# Patient Record
Sex: Male | Born: 1975
Health system: Southern US, Community
[De-identification: ages and names within clinical notes are randomized; demographics above are authoritative.]

## PROBLEM LIST (undated history)

## (undated) DIAGNOSIS — I499 Cardiac arrhythmia, unspecified: Secondary | ICD-10-CM

## (undated) DIAGNOSIS — R112 Nausea with vomiting, unspecified: Secondary | ICD-10-CM

## (undated) DIAGNOSIS — K219 Gastro-esophageal reflux disease without esophagitis: Secondary | ICD-10-CM

## (undated) DIAGNOSIS — Z9889 Other specified postprocedural states: Secondary | ICD-10-CM

## (undated) HISTORY — PX: DIAGNOSTIC LAPAROSCOPY: SUR761

---

## 2007-03-28 ENCOUNTER — Ambulatory Visit: Payer: Self-pay | Admitting: Family Medicine

## 2008-07-23 ENCOUNTER — Ambulatory Visit: Payer: Self-pay | Admitting: Diagnostic Radiology

## 2008-07-23 ENCOUNTER — Emergency Department (HOSPITAL_BASED_OUTPATIENT_CLINIC_OR_DEPARTMENT_OTHER): Admission: EM | Admit: 2008-07-23 | Discharge: 2008-07-23 | Payer: Self-pay | Admitting: Emergency Medicine

## 2008-12-12 ENCOUNTER — Ambulatory Visit: Payer: Self-pay | Admitting: Sports Medicine

## 2008-12-12 DIAGNOSIS — K429 Umbilical hernia without obstruction or gangrene: Secondary | ICD-10-CM | POA: Insufficient documentation

## 2009-07-18 ENCOUNTER — Ambulatory Visit: Payer: Self-pay | Admitting: Family Medicine

## 2009-08-16 ENCOUNTER — Emergency Department (HOSPITAL_BASED_OUTPATIENT_CLINIC_OR_DEPARTMENT_OTHER): Admission: EM | Admit: 2009-08-16 | Discharge: 2009-08-16 | Payer: Self-pay | Admitting: Emergency Medicine

## 2010-02-03 ENCOUNTER — Ambulatory Visit: Payer: Self-pay | Admitting: Family Medicine

## 2010-02-23 IMAGING — CT CT ABDOMEN W/ CM
2 of 5 series · 15 of 46 positions shown, 17 images · IV contrast (APPLIED)
Comparison: None available

CT ABDOMEN

CLINICAL DATA: Abdominal pain.  Nausea.

CT ABDOMEN AND PELVIS WITH CONTRAST
TECHNIQUE: Multidetector CT imaging of the abdomen and pelvis was
performed using the standard protocol following bolus
administration of intravenous contrast.
Contrast: 100 ml [id number].

[Series 2: abd/pelvis 5.0 b31f · axial · 0.72mm/px · z∈[-279,+146]mm · 12 of 96 slices shown, 14 images]
[im 6/96  soft-tissue]
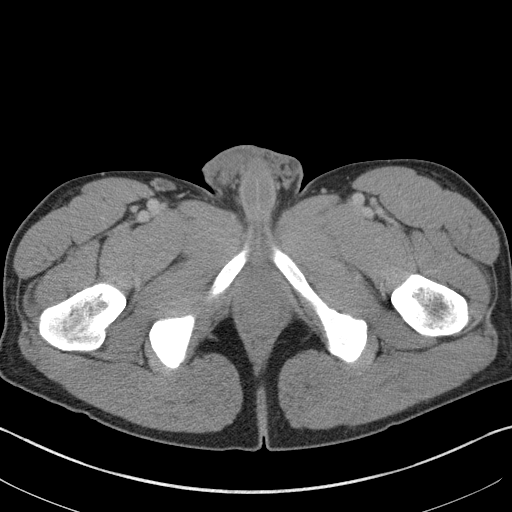
[im 6/96  bone]
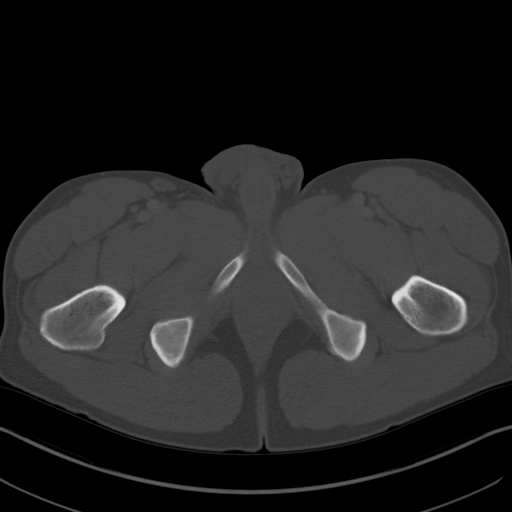
[im 16/96  soft-tissue]
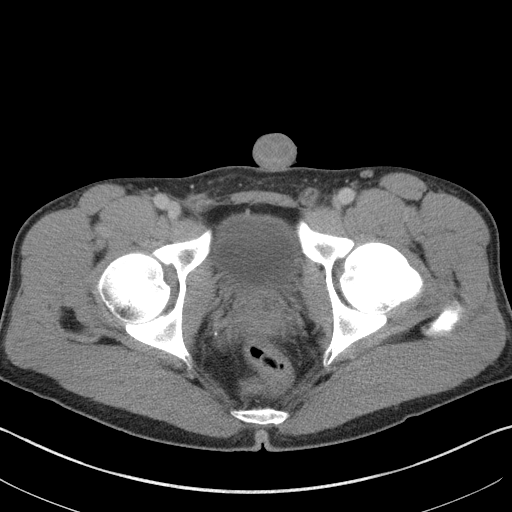
[im 21/96  soft-tissue]
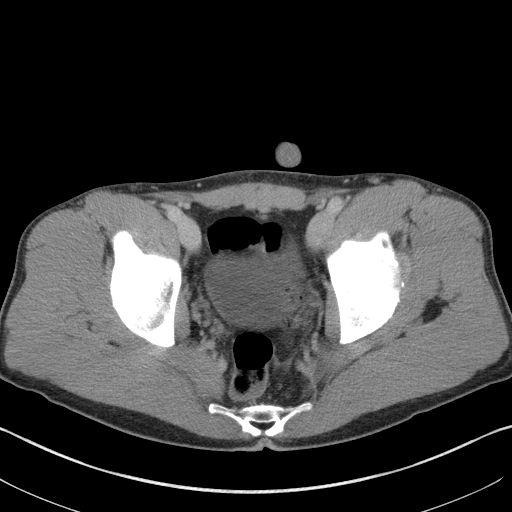
[im 31/96  soft-tissue]
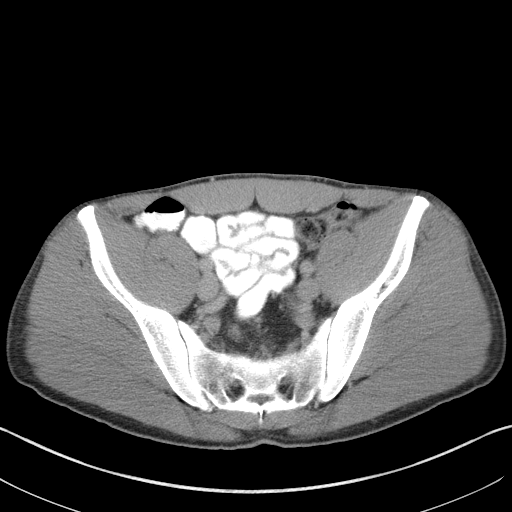
[im 36/96  soft-tissue]
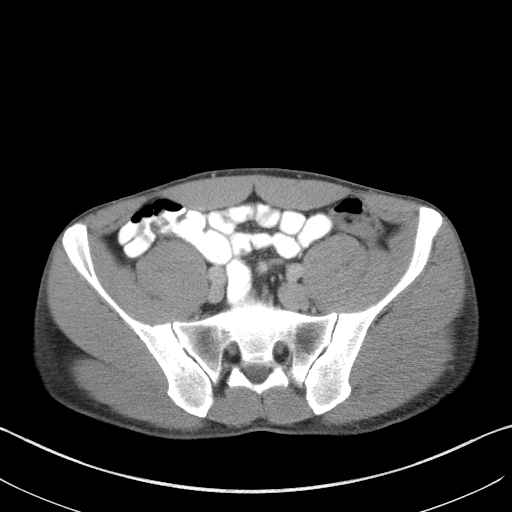
[im 46/96  soft-tissue]
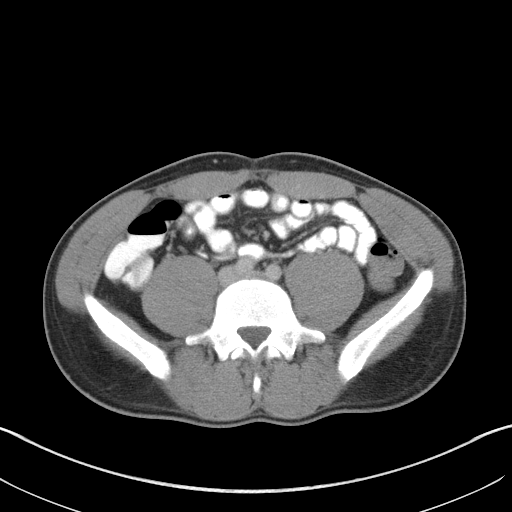
[im 51/96  soft-tissue]
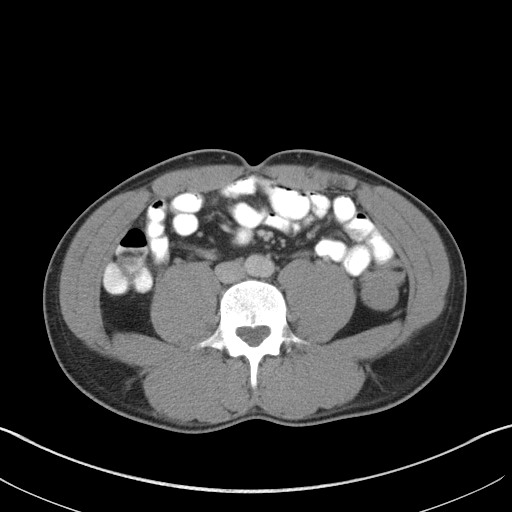
[im 61/96  soft-tissue]
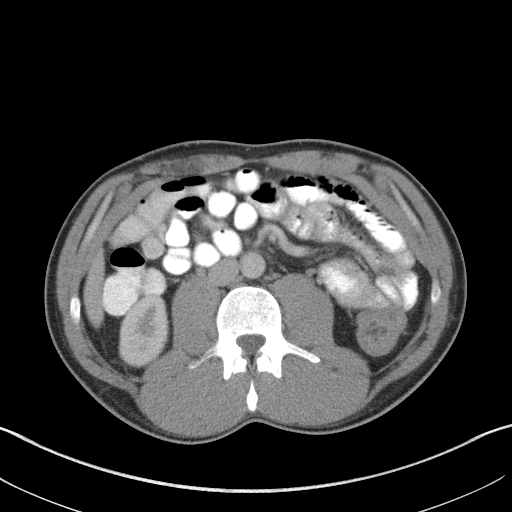
[im 66/96  soft-tissue]
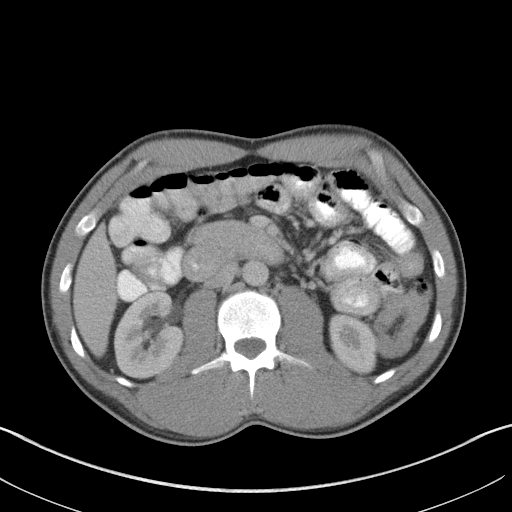
[im 66/96  bone]
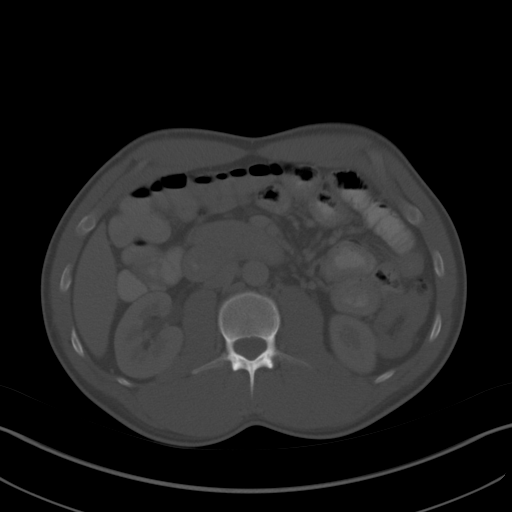
[im 76/96  soft-tissue]
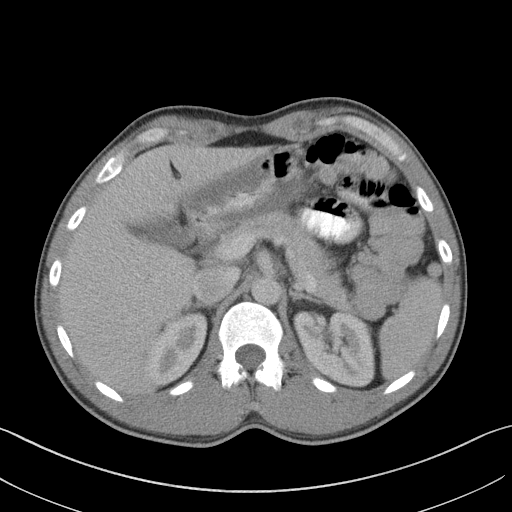
[im 81/96  soft-tissue]
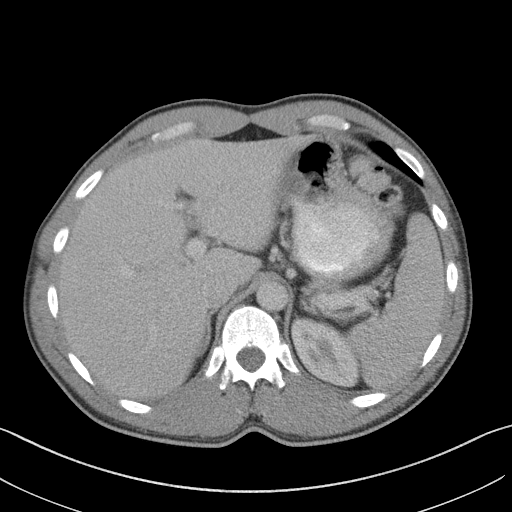
[im 91/96  soft-tissue]
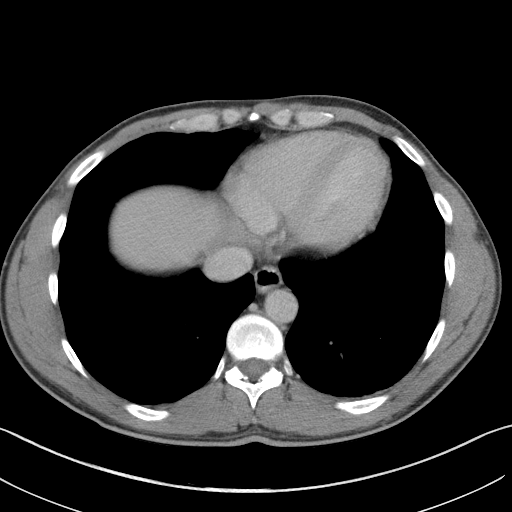

[Series 3: abd/pelvis 2.0 coronal · coronal · 0.80mm/px · 3 of 119 slices shown]
[im 40/119  soft-tissue]
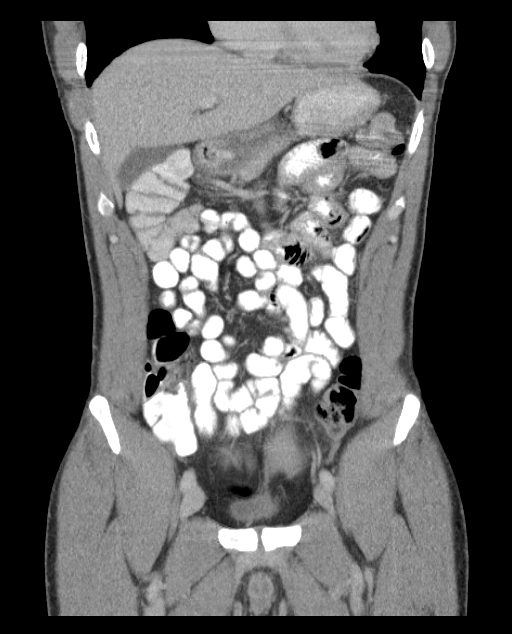
[im 53/119  soft-tissue]
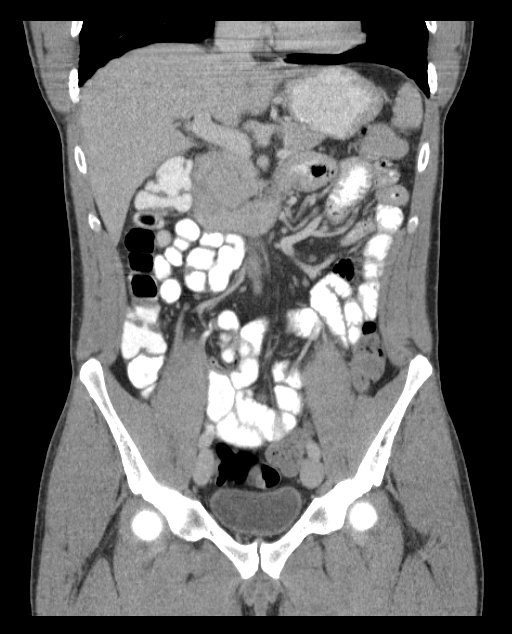
[im 66/119  soft-tissue]
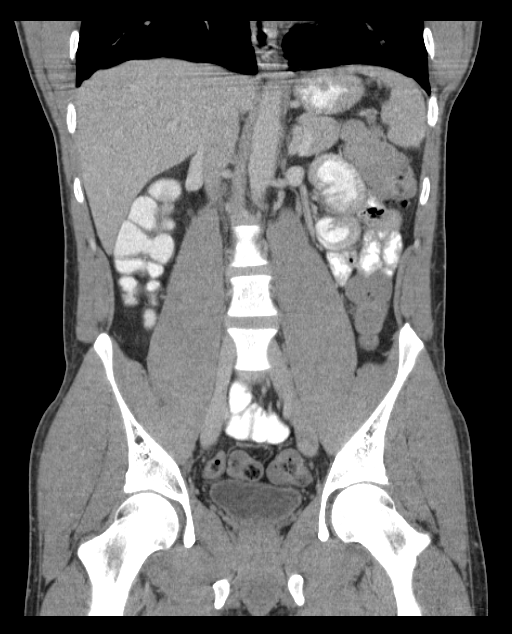

[15 of 46 positions shown; findings below may reference images not displayed]

FINDINGS: Lung bases appear clear.  Liver and gallbladder within
normal limits.  Spleen unremarkable.  Normal renal enhancement and
delayed excretion of contrast.  The antrum of the stomach is
markedly thickened, measuring up to 1.8 cm.  There is low
attenuation of the antrum as well.  Distal to the pylorus, the
proximal duodenum appears within normal limits.  The distal
duodenum and jejunum are abnormal, with dilation and wall
thickening.  There is no obstructing lesion identified.  Beginning
in the mid jejunum, more normal caliber and wall thickness is
assumed.  No significant adenopathy.  No perforation or free air.
IMPRESSION: 1.  Marked thickening of the antrum of the stomach with low
attenuation.  Differential considerations include inflammatory
bowel disease/Crohn's disease, lymphoma, or severe gastritis.
Endoscopic correlation recommended. Gastrointestinal stromal tumor
felt unlikely based on multi focality.
2.  Dilation and thickening of distal duodenum and proximal
jejunum, with mural thickening and mild dilation.  The differential
considerations same as above.  Infection with atypical organisms
could produce this appearance as well.

CT PELVIS
FINDINGS: The pelvic small bowel appears within normal limits.
Terminal ileum within normal limits. The cecum appears within
normal limits.  Ileocecal valve unremarkable.  Normal appendix
identified.  No free fluid in the anatomic pelvis.  Mild thickening
of the rectum is likely due to under distention.

Marked thickening of the superior descending colon is present, at
the level of the splenic flexure on image number 30.  Mural
thickness extends up to 11 mm.  This is immediately adjacent to
inflamed jejunum.  Transverse colon assumes a more normal mural
thickness and the ascending colon is within normal limits aside
from air fluid levels.
IMPRESSION: Colitis of the proximal descending colon near the splenic flexure.
Given multi focality of the enteritis, Crohn's disease is given
primary consideration with lymphoma felt less likely.  Infection
could produce this appearance but this would be an uncommon
presentation for infectious enterocolitis.

## 2010-04-27 ENCOUNTER — Ambulatory Visit: Payer: Self-pay | Admitting: Family Medicine

## 2010-09-22 LAB — URINALYSIS, ROUTINE W REFLEX MICROSCOPIC
Bilirubin Urine: NEGATIVE
Hgb urine dipstick: NEGATIVE
Ketones, ur: 15 mg/dL — AB
Specific Gravity, Urine: 1.028 (ref 1.005–1.030)
pH: 7 (ref 5.0–8.0)

## 2010-09-22 LAB — LIPASE, BLOOD: Lipase: 379 U/L — ABNORMAL HIGH (ref 23–300)

## 2010-09-22 LAB — CBC
Hemoglobin: 16.6 g/dL (ref 13.0–17.0)
MCHC: 35.4 g/dL (ref 30.0–36.0)
MCV: 91.2 fL (ref 78.0–100.0)
RBC: 5.14 MIL/uL (ref 4.22–5.81)
RDW: 12.9 % (ref 11.5–15.5)

## 2010-09-22 LAB — COMPREHENSIVE METABOLIC PANEL
BUN: 16 mg/dL (ref 6–23)
CO2: 27 mEq/L (ref 19–32)
Calcium: 10.2 mg/dL (ref 8.4–10.5)
Creatinine, Ser: 1.2 mg/dL (ref 0.4–1.5)
GFR calc non Af Amer: >60 mL/min (ref >60)
Glucose, Bld: 114 mg/dL — ABNORMAL HIGH (ref 70–99)
Total Protein: 8.1 g/dL (ref 6.0–8.3)

## 2010-09-22 LAB — DIFFERENTIAL
Eosinophils Absolute: 0.1 10*3/uL (ref 0.0–0.7)
Lymphocytes Relative: 12 % (ref 12–46)
Lymphs Abs: 1.2 10*3/uL (ref 0.7–4.0)
Neutro Abs: 8.4 10*3/uL — ABNORMAL HIGH (ref 1.7–7.7)
Neutrophils Relative %: 81 % — ABNORMAL HIGH (ref 43–77)

## 2010-11-08 ENCOUNTER — Emergency Department (INDEPENDENT_AMBULATORY_CARE_PROVIDER_SITE_OTHER): Payer: BC Managed Care – PPO

## 2010-11-08 ENCOUNTER — Emergency Department (HOSPITAL_BASED_OUTPATIENT_CLINIC_OR_DEPARTMENT_OTHER)
Admission: EM | Admit: 2010-11-08 | Discharge: 2010-11-08 | Disposition: A | Discharge status: Home / Self Care | Payer: BC Managed Care – PPO | Attending: Emergency Medicine | Admitting: Emergency Medicine

## 2010-11-08 DIAGNOSIS — R002 Palpitations: Secondary | ICD-10-CM | POA: Insufficient documentation

## 2010-11-08 DIAGNOSIS — R079 Chest pain, unspecified: Secondary | ICD-10-CM | POA: Insufficient documentation

## 2010-11-08 LAB — CBC
HCT: 39.4 % (ref 39.0–52.0)
Hemoglobin: 14.4 g/dL (ref 13.0–17.0)
RBC: 4.69 MIL/uL (ref 4.22–5.81)
RDW: 12.4 % (ref 11.5–15.5)
WBC: 7 10*3/uL (ref 4.0–10.5)

## 2010-11-08 LAB — COMPREHENSIVE METABOLIC PANEL
ALT: 12 U/L (ref 0–53)
Albumin: 4.4 g/dL (ref 3.5–5.2)
Alkaline Phosphatase: 54 U/L (ref 39–117)
Chloride: 100 mEq/L (ref 96–112)
Glucose, Bld: 111 mg/dL — ABNORMAL HIGH (ref 70–99)
Potassium: 3.8 mEq/L (ref 3.5–5.1)
Sodium: 138 mEq/L (ref 135–145)
Total Protein: 7.4 g/dL (ref 6.0–8.3)

## 2010-11-08 LAB — CK TOTAL AND CKMB (NOT AT ARMC): Total CK: 140 U/L (ref 7–232)

## 2010-11-09 ENCOUNTER — Telehealth: Payer: Self-pay

## 2010-11-09 LAB — TSH: TSH: 1.104 u[IU]/mL (ref 0.350–4.500)

## 2010-11-09 NOTE — Telephone Encounter (Signed)
Pt informed and made apt.to come in

## 2010-11-11 ENCOUNTER — Encounter: Payer: Self-pay | Admitting: Family Medicine

## 2010-11-12 ENCOUNTER — Ambulatory Visit (INDEPENDENT_AMBULATORY_CARE_PROVIDER_SITE_OTHER): Payer: BC Managed Care – PPO | Admitting: Family Medicine

## 2010-11-12 ENCOUNTER — Encounter: Payer: Self-pay | Admitting: Family Medicine

## 2010-11-12 VITALS — BP 108/76 | HR 56 | Temp 98.3°F | Wt 186.0 lb

## 2010-11-12 DIAGNOSIS — K219 Gastro-esophageal reflux disease without esophagitis: Secondary | ICD-10-CM

## 2010-11-12 DIAGNOSIS — Z8679 Personal history of other diseases of the circulatory system: Secondary | ICD-10-CM

## 2010-11-12 NOTE — Progress Notes (Signed)
  Subjective:    Patient ID: Ronald Clark, male    DOB: 1975/06/16, 35 y.o.   MRN: [id number]  HPI he is here for followup visit. He was recently seen in an urgent care Center and evaluated for rapid heart rate. That information was reviewed and is essentially negative. His history is such that earlier in the day he did have approximately 4 beers and shortly after that experienced rapid heart rate that went away after about 10 minutes. Since then he did see a cardiologist and an echocardiogram was done which is pending. He would also like a refill on his Protonix which he says helps with his reflux symptoms.  Review of Systems Negative except as above    Objective:   Physical Exam Alert and in no distress otherwise not examined       Assessment & Plan:  History is consistent with alcohol-related atrial fibrillation. GERD. This was discussed with him and his mother. If the echo comes back negative, no further intervention will be needed. Protonix was renewed.

## 2011-10-21 ENCOUNTER — Telehealth: Payer: Self-pay | Admitting: Internal Medicine

## 2011-10-21 NOTE — Telephone Encounter (Signed)
He is to see the chiropractor first and if no improvement, he will come in and see me

## 2011-10-21 NOTE — Telephone Encounter (Signed)
Ronald Clark is leaving the country and will be back in June so w

## 2011-10-21 NOTE — Telephone Encounter (Signed)
Ronald Clark is leaving the country and will be back in June so if he can get a referral would like an appt around June 10,11,or 12th.

## 2011-10-27 ENCOUNTER — Encounter: Payer: Self-pay | Admitting: Family Medicine

## 2011-10-27 ENCOUNTER — Ambulatory Visit (INDEPENDENT_AMBULATORY_CARE_PROVIDER_SITE_OTHER): Payer: BC Managed Care – PPO | Admitting: Family Medicine

## 2011-10-27 VITALS — BP 110/60 | Wt 194.0 lb

## 2011-10-27 DIAGNOSIS — R1903 Right lower quadrant abdominal swelling, mass and lump: Secondary | ICD-10-CM

## 2011-10-27 DIAGNOSIS — R1909 Other intra-abdominal and pelvic swelling, mass and lump: Secondary | ICD-10-CM

## 2011-10-27 NOTE — Patient Instructions (Signed)
If it gets bigger let me know

## 2011-10-27 NOTE — Progress Notes (Signed)
  Subjective:    Patient ID: Ronald Clark, male    DOB: 04/11/1976, 36 y.o.   MRN: [id number]  HPI He had some musculoskeletal manipulation done yesterday in the lower abdominal and inguinal region. He later noted a painful lesion in the right inguinal area. It is less painful today.  Review of Systems     Objective:   Physical Exam A 1 cm round smooth movable cystic type lesion is noted in the mid right inguinal area. There are several other round smooth less movable lesions noted in the area.       Assessment & Plan:   1. Mass of right inguinal region    it definitely feels benign and is either a cyst or possible small herniation. It does not feel like a lymph node. I reassured him that this was nothing to worry about however if the swelling and pain gets worse, he is to call me

## 2012-05-09 ENCOUNTER — Other Ambulatory Visit: Payer: Self-pay | Admitting: Family Medicine

## 2012-05-09 MED ORDER — ZOLPIDEM TARTRATE 10 MG PO TABS: 10.0000 mg | ORAL_TABLET | Freq: Every evening | ORAL | Status: DC | PRN

## 2012-05-09 NOTE — Progress Notes (Signed)
He has been having difficulty with stress related sleep disturbance. I recommended that he go to the [email or web address] website to get information concerning sleep hygiene. I will also call in Ambien.

## 2012-07-14 ENCOUNTER — Other Ambulatory Visit: Payer: Self-pay

## 2012-07-14 ENCOUNTER — Other Ambulatory Visit: Payer: Self-pay | Admitting: Family Medicine

## 2012-07-14 MED ORDER — LORAZEPAM 0.5 MG PO TABS: 0.5000 mg | ORAL_TABLET | Freq: Two times a day (BID) | ORAL | Status: DC | PRN

## 2012-07-14 NOTE — Telephone Encounter (Signed)
CALLED ATIVAN .5 PER JCL

## 2012-07-14 NOTE — Progress Notes (Signed)
I received a call from Ronald Clark concerning him. 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 He does have panic attacks. I will give Xanax. When he returns I will discuss placing him on a different medication to help with some underlying panic disorder.

## 2012-08-01 ENCOUNTER — Encounter: Payer: Self-pay | Admitting: Family Medicine

## 2012-08-01 ENCOUNTER — Ambulatory Visit (INDEPENDENT_AMBULATORY_CARE_PROVIDER_SITE_OTHER): Payer: BC Managed Care – PPO | Admitting: Family Medicine

## 2012-08-01 VITALS — BP 110/70 | HR 60 | Wt 190.0 lb

## 2012-08-01 DIAGNOSIS — F410 Panic disorder [episodic paroxysmal anxiety] without agoraphobia: Secondary | ICD-10-CM

## 2012-08-01 MED ORDER — CITALOPRAM HYDROBROMIDE 20 MG PO TABS: 20.0000 mg | ORAL_TABLET | Freq: Every day | ORAL | Status: DC

## 2012-08-01 NOTE — Progress Notes (Signed)
  Subjective:    Patient ID: Ronald Clark, male    DOB: [birth year], 37 y.o.   MRN: [id number]  HPI He is here for consultation. He has been involved in counseling for least 4 sessions helping to deal with anxiety and panic. 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 He is here to make some insight into noted behind his anxiety but there is no true good reason as yet that has been identified. He was given Ativan which did help with flying as well as with his panic symptoms. He is having some difficulties dealing with an Development worker, international aid    Review of Systems     Objective:   Physical Exam Alert and in no distress with appropriate affect       Assessment & Plan:  Panic disorder - Plan: citalopram (CELEXA) 20 MG tablet I will add Celexa to his regimen. Encouraged him to use Ativan on an as-needed basis which she has been doing. We discussed counseling and he will continue with counseling. We also discussed the problem he is having with an Development worker, international aid. Strongly encouraged him to have rules and guidelines written down and set up regular meetings with the coach. Check here in one month

## 2012-08-28 ENCOUNTER — Encounter: Payer: Self-pay | Admitting: Family Medicine

## 2012-08-28 ENCOUNTER — Ambulatory Visit (INDEPENDENT_AMBULATORY_CARE_PROVIDER_SITE_OTHER): Payer: BC Managed Care – PPO | Admitting: Family Medicine

## 2012-08-28 VITALS — BP 110/72 | HR 68 | Wt 191.0 lb

## 2012-08-28 DIAGNOSIS — F410 Panic disorder [episodic paroxysmal anxiety] without agoraphobia: Secondary | ICD-10-CM | POA: Insufficient documentation

## 2012-08-28 DIAGNOSIS — J029 Acute pharyngitis, unspecified: Secondary | ICD-10-CM

## 2012-08-28 DIAGNOSIS — J019 Acute sinusitis, unspecified: Secondary | ICD-10-CM

## 2012-08-28 MED ORDER — CITALOPRAM HYDROBROMIDE 40 MG PO TABS: 40.0000 mg | ORAL_TABLET | Freq: Every day | ORAL | Status: DC

## 2012-08-28 MED ORDER — AMOXICILLIN 875 MG PO TABS: 875.0000 mg | ORAL_TABLET | Freq: Two times a day (BID) | ORAL | Status: DC

## 2012-08-28 NOTE — Progress Notes (Signed)
  Subjective:    Patient ID: Ronald Clark, male    DOB: 01-25-1976, 37 y.o.   MRN: [id number]  HPI 2 weeks ago he noted the onset of a sore throat followed by nasal congestion and purulent postnasal drainage and rhinorrhea. This has improved he is still having some difficulty with PND as well as slight sore throat no fever, chills or earache. He does not smoke. He continues in counseling with Evette Cristal. He did have a panic attack necessitating him driving rather than flying back to [place].he states that he thinks the Celexa has helped but not as much as he would like.  Review of Systems     Objective:   Physical Exam alert and in no distress. Tympanic membranes and canals are normal. Throat is clear. Tonsils are normal. Neck is supple without adenopathy or thyromegaly. Cardiac exam shows a regular sinus rhythm without murmurs or gallops. Lungs are clear to auscultation. Nasal mucosa is normal with no tenderness over sinuses.       Assessment & Plan:  Panic disorder - Plan: citalopram (CELEXA) 40 MG tablet  Acute sinusitis - Plan: amoxicillin (AMOXIL) 875 MG tablet  Acute pharyngitis - Plan: amoxicillin (AMOXIL) 875 MG tablet his symptoms are more consistent with sinus infection. He is to call me if not entirely better when he finishes the antibiotic. I will also increase his Celexa. He will continue in counseling. When he goes on his next trip to [place] which will be in May, I will make sure he has medications needed for the trip.

## 2012-08-28 NOTE — Patient Instructions (Signed)
Take all the antibiotic and if not fully back to normal when you finish call me and I will give you more. Call me in about a month concerning the increase in the medicine and also if you need refills before you go on her trip

## 2012-09-05 ENCOUNTER — Encounter: Payer: Self-pay | Admitting: Family Medicine

## 2012-09-05 ENCOUNTER — Ambulatory Visit (INDEPENDENT_AMBULATORY_CARE_PROVIDER_SITE_OTHER): Payer: BC Managed Care – PPO | Admitting: Family Medicine

## 2012-09-05 VITALS — BP 110/70 | HR 63 | Wt 188.0 lb

## 2012-09-05 DIAGNOSIS — J029 Acute pharyngitis, unspecified: Secondary | ICD-10-CM

## 2012-09-05 DIAGNOSIS — R5381 Other malaise: Secondary | ICD-10-CM

## 2012-09-05 DIAGNOSIS — R590 Localized enlarged lymph nodes: Secondary | ICD-10-CM

## 2012-09-05 DIAGNOSIS — R5383 Other fatigue: Secondary | ICD-10-CM

## 2012-09-05 DIAGNOSIS — R599 Enlarged lymph nodes, unspecified: Secondary | ICD-10-CM

## 2012-09-05 LAB — CBC WITH DIFFERENTIAL/PLATELET
Hemoglobin: 15.5 g/dL (ref 13.0–17.0)
Lymphs Abs: 1.3 10*3/uL (ref 0.7–4.0)
Monocytes Relative: 7 % (ref 3–12)
Neutro Abs: 6.7 10*3/uL (ref 1.7–7.7)
Neutrophils Relative %: 77 % (ref 43–77)
RBC: 5.17 MIL/uL (ref 4.22–5.81)

## 2012-09-05 LAB — COMPREHENSIVE METABOLIC PANEL
Albumin: 4.4 g/dL (ref 3.5–5.2)
CO2: 28 mEq/L (ref 19–32)
Glucose, Bld: 81 mg/dL (ref 70–99)
Potassium: 4.2 mEq/L (ref 3.5–5.3)
Sodium: 140 mEq/L (ref 135–145)
Total Protein: 6.9 g/dL (ref 6.0–8.3)

## 2012-09-05 LAB — POCT RAPID STREP A (OFFICE): Rapid Strep A Screen: NEGATIVE

## 2012-09-05 NOTE — Progress Notes (Signed)
  Subjective:    Patient ID: Ronald Clark, male    DOB: 05/04/1976, 38 y.o.   MRN: [id number]  HPI He is here for recheck. She states that he notes difficulty with swallowing his saliva however he has no difficulty with eating or swallowing food. He also notes slight swelling in the right anterior cervical area as well as fatigue.no fever, chills, cough or congestion   Review of Systems     Objective:   Physical Exam Alert and in no distress. TMs are normal. Throat is clear. Neck is supple with  One 3cm right anterior cervical lymph node your the carotid notch. No lateral cervical, axillary or inguinal adenopathy is noted. No hepatosplenomegaly. No supraclavicular or lesions palpated Strep screen is negative.      Assessment & Plan:  Sore throat - Plan: POCT rapid strep A  Fatigue - Plan: CBC with Differential, Comprehensive metabolic panel  Lymphadenopathy of right cervical region - Plan: CBC with Differential, Comprehensive metabolic panel

## 2012-10-02 ENCOUNTER — Other Ambulatory Visit: Payer: Self-pay | Admitting: Family Medicine

## 2012-11-07 ENCOUNTER — Telehealth: Payer: Self-pay | Admitting: Family Medicine

## 2012-11-07 MED ORDER — LORAZEPAM 0.5 MG PO TABS
0.5000 mg | ORAL_TABLET | Freq: Two times a day (BID) | ORAL | Status: DC | PRN
Start: 2012-11-07 — End: 2012-11-08

## 2012-11-07 NOTE — Telephone Encounter (Signed)
Pt called back and states needs refill on Ativan also.

## 2012-11-07 NOTE — Telephone Encounter (Signed)
CALLED IN ATIVAN PER JCL

## 2012-11-07 NOTE — Telephone Encounter (Signed)
Okay to renew

## 2012-11-08 ENCOUNTER — Telehealth: Payer: Self-pay | Admitting: Family Medicine

## 2012-11-08 ENCOUNTER — Other Ambulatory Visit: Payer: Self-pay

## 2012-11-08 MED ORDER — ALPRAZOLAM 0.5 MG PO TABS: 0.5000 mg | ORAL_TABLET | Freq: Two times a day (BID) | ORAL | Status: DC | PRN

## 2012-11-08 MED ORDER — ZOLPIDEM TARTRATE 10 MG PO TABS: 10.0000 mg | ORAL_TABLET | Freq: Every evening | ORAL | Status: DC | PRN

## 2012-11-08 NOTE — Telephone Encounter (Signed)
CALLED IN [facility] AND XANAX PER JCL

## 2012-11-08 NOTE — Telephone Encounter (Signed)
Apparently Xanax is working much better than Ativan and sometimes he has to take 2 Ativan especially when he gets stressed. He is going through a stressful time frame with soccer [place] and the impending soccer season. He continues to see Evette Cristal which has been quite helpful. I recommended that he talk to them about using relaxation techniques to also help with sleep. He did state that the antidepressant is working well.

## 2013-01-31 ENCOUNTER — Other Ambulatory Visit: Payer: Self-pay | Admitting: Family Medicine

## 2013-01-31 NOTE — Telephone Encounter (Signed)
Is this okay to refill? 

## 2013-04-02 ENCOUNTER — Telehealth: Payer: Self-pay | Admitting: Family Medicine

## 2013-04-02 NOTE — Telephone Encounter (Signed)
Pt is requesting a referral to [facility] to address hernia's. The referral needs to be to Dr. Buzzy Han. Please call wife if issue.

## 2013-04-04 ENCOUNTER — Ambulatory Visit (INDEPENDENT_AMBULATORY_CARE_PROVIDER_SITE_OTHER): Payer: BC Managed Care – PPO | Admitting: Family Medicine

## 2013-04-04 VITALS — BP 100/70 | HR 57 | Wt 210.0 lb

## 2013-04-04 DIAGNOSIS — R351 Nocturia: Secondary | ICD-10-CM

## 2013-04-04 DIAGNOSIS — IMO0002 Reserved for concepts with insufficient information to code with codable children: Secondary | ICD-10-CM

## 2013-04-04 DIAGNOSIS — S39011A Strain of muscle, fascia and tendon of abdomen, initial encounter: Secondary | ICD-10-CM

## 2013-04-04 NOTE — Progress Notes (Signed)
  Subjective:    Patient ID: Ronald Clark, male    DOB: 1975-09-12, 37 y.o.   MRN: [id number]  HPI 2 weeks ago he noticed right groin pain is radiation into the right testes. He has been doing extra weight training but he did not notice any pain while training. He notices the pain does get worse when he does certain physical activities but describes relatively static type of activities. He also continues to complain of nocturia as much is 5 times per night. He states that during the day he urinates usually no more than 3 times. The urine at night is light yellow in color.   Review of Systems     Objective:   Physical Exam Alert and in no distress. Abdominal exam shows no masses or tenderness. Inguinal exam including hernia check was negative. Testes normal.       Assessment & Plan:  Abdominal muscle strain, initial encounter  Nocturia  plan the this is mainly muscular and no evidence of hernia. Recommended relative rest including no vigorous physical activity. Discussed his nocturia. He did recommend he keep track of his urinary symptoms during the day and night. Recommend cutting back on fluids especially at night. I explained that sometimes people wake up in the middle night and then urinate but it's really not because of a full bladder.

## 2013-05-02 NOTE — Telephone Encounter (Signed)
05/02/2013 °

## 2013-05-08 ENCOUNTER — Other Ambulatory Visit: Payer: Self-pay | Admitting: Family Medicine

## 2013-05-09 NOTE — Telephone Encounter (Signed)
Is this okay to call in? 

## 2013-05-10 ENCOUNTER — Other Ambulatory Visit: Payer: Self-pay

## 2013-05-10 NOTE — Telephone Encounter (Signed)
CALLED IN XANAX PER JCL 

## 2013-05-10 NOTE — Telephone Encounter (Signed)
Okay to renew

## 2013-05-13 ENCOUNTER — Other Ambulatory Visit: Payer: Self-pay | Admitting: Family Medicine

## 2013-05-14 NOTE — Telephone Encounter (Signed)
IS THIS OKAY 

## 2013-05-14 NOTE — Telephone Encounter (Signed)
Okay to renew

## 2013-06-26 ENCOUNTER — Other Ambulatory Visit: Payer: Self-pay | Admitting: Family Medicine

## 2013-06-27 NOTE — Telephone Encounter (Signed)
He needs a follow-up appointment

## 2013-06-27 NOTE — Telephone Encounter (Signed)
Is this okay?

## 2013-08-03 ENCOUNTER — Other Ambulatory Visit: Payer: Self-pay | Admitting: Family Medicine

## 2013-08-03 NOTE — Telephone Encounter (Signed)
IS THIS OKAY TO REFILL 

## 2013-10-23 ENCOUNTER — Other Ambulatory Visit: Payer: Self-pay | Admitting: Family Medicine

## 2013-11-29 ENCOUNTER — Other Ambulatory Visit: Payer: Self-pay | Admitting: Family Medicine

## 2013-11-30 ENCOUNTER — Telehealth: Payer: Self-pay | Admitting: Family Medicine

## 2013-11-30 MED ORDER — CITALOPRAM HYDROBROMIDE 40 MG PO TABS: 40.0000 mg | ORAL_TABLET | Freq: Every day | ORAL | Status: DC

## 2013-11-30 NOTE — Telephone Encounter (Signed)
Let him know that I called the medication and that I would like to sit down and talk to him again and see how he is doing.

## 2013-11-30 NOTE — Telephone Encounter (Signed)
Is this ok to refill?  

## 2013-11-30 NOTE — Addendum Note (Signed)
Addended by: Lieutenant DiegoHINES, KEBA A on: 11/30/2013 11:44 AM   Modules accepted: Orders

## 2013-11-30 NOTE — Telephone Encounter (Signed)
lm

## 2013-11-30 NOTE — Telephone Encounter (Signed)
Left message on pt VM with info 

## 2013-12-26 ENCOUNTER — Telehealth: Payer: Self-pay | Admitting: Family Medicine

## 2013-12-26 ENCOUNTER — Other Ambulatory Visit: Payer: Self-pay | Admitting: Family Medicine

## 2013-12-26 MED ORDER — PANTOPRAZOLE SODIUM 40 MG PO TBEC: 40.0000 mg | DELAYED_RELEASE_TABLET | Freq: Every day | ORAL | Status: DC

## 2013-12-26 NOTE — Telephone Encounter (Signed)
Rx sent per David Tysinger PAC. CLS 

## 2013-12-26 NOTE — Telephone Encounter (Signed)
pls call it out or e scribe

## 2013-12-26 NOTE — Telephone Encounter (Signed)
Pt called and stated that he needed a refill on protonix. Pt uses [facility] [address] in [place]

## 2014-01-16 ENCOUNTER — Ambulatory Visit (INDEPENDENT_AMBULATORY_CARE_PROVIDER_SITE_OTHER): Payer: BC Managed Care – PPO | Admitting: Family Medicine

## 2014-01-16 ENCOUNTER — Encounter: Payer: Self-pay | Admitting: Family Medicine

## 2014-01-16 VITALS — BP 110/70 | HR 62 | Wt 217.0 lb

## 2014-01-16 DIAGNOSIS — Z566 Other physical and mental strain related to work: Secondary | ICD-10-CM

## 2014-01-16 DIAGNOSIS — Z569 Unspecified problems related to employment: Secondary | ICD-10-CM

## 2014-01-16 DIAGNOSIS — F410 Panic disorder [episodic paroxysmal anxiety] without agoraphobia: Secondary | ICD-10-CM

## 2014-01-16 MED ORDER — CITALOPRAM HYDROBROMIDE 40 MG PO TABS: 40.0000 mg | ORAL_TABLET | Freq: Every day | ORAL | Status: DC

## 2014-01-16 MED ORDER — ZOLPIDEM TARTRATE 10 MG PO TABS: ORAL_TABLET | ORAL | Status: DC

## 2014-01-16 MED ORDER — ALPRAZOLAM 0.5 MG PO TABS: ORAL_TABLET | ORAL | Status: DC

## 2014-01-16 NOTE — Progress Notes (Signed)
   Subjective:    Patient ID: Marcello FennelJustin Ishaq, male    DOB: 08/28/1975, 38 y.o.   MRN: [id number]  HPI He is here for recheck. He recently ran out of his sight telegram and did have withdrawal symptoms of anxiety, crying etc. I explained to him that it was definitely withdrawal and not a return of his symptoms. He feels he has done quite well on his sight telegram. He uses Xanax only once or twice per week and usually during the season. The same is true for the Ambien. He has been in counseling in the past for this. Stressors include work related stress, beginning of the season, a 34-week-old child at home.   Review of Systems     Objective:   Physical Exam Alert and in no distress with appropriate affect       Assessment & Plan:  Panic disorder - Plan: citalopram (CELEXA) 40 MG tablet, zolpidem (AMBIEN) 10 MG tablet, ALPRAZolam (XANAX) 0.5 MG tablet  Work-related stress - Plan: citalopram (CELEXA) 40 MG tablet, zolpidem (AMBIEN) 10 MG tablet, ALPRAZolam (XANAX) 0.5 MG tablet  I will renew his medications. I did discuss stress and stress management with him. Recommend following up with Daryel GeraldKenneth Fraser who he has seen in the past after the season is over to learn some new stress reducing techniques .

## 2014-05-21 ENCOUNTER — Other Ambulatory Visit: Payer: Self-pay | Admitting: Family Medicine

## 2014-05-21 NOTE — Telephone Encounter (Signed)
Is this okay?

## 2014-05-21 NOTE — Telephone Encounter (Signed)
Called in med to pharmacy  

## 2014-07-10 ENCOUNTER — Encounter: Payer: Self-pay | Admitting: Family Medicine

## 2014-07-10 ENCOUNTER — Ambulatory Visit (INDEPENDENT_AMBULATORY_CARE_PROVIDER_SITE_OTHER): Payer: BC Managed Care – PPO | Admitting: Family Medicine

## 2014-07-10 VITALS — BP 114/70 | HR 60 | Wt 217.0 lb

## 2014-07-10 DIAGNOSIS — Z8679 Personal history of other diseases of the circulatory system: Secondary | ICD-10-CM | POA: Insufficient documentation

## 2014-07-10 DIAGNOSIS — R5383 Other fatigue: Secondary | ICD-10-CM

## 2014-07-10 DIAGNOSIS — M1990 Unspecified osteoarthritis, unspecified site: Secondary | ICD-10-CM

## 2014-07-10 DIAGNOSIS — R5381 Other malaise: Secondary | ICD-10-CM

## 2014-07-10 DIAGNOSIS — M25572 Pain in left ankle and joints of left foot: Secondary | ICD-10-CM

## 2014-07-10 DIAGNOSIS — Z8711 Personal history of peptic ulcer disease: Secondary | ICD-10-CM

## 2014-07-10 LAB — CBC WITH DIFFERENTIAL/PLATELET
BASOS PCT: 0 % (ref 0–1)
Basophils Absolute: 0 10*3/uL (ref 0.0–0.1)
EOS ABS: 0.2 10*3/uL (ref 0.0–0.7)
Eosinophils Relative: 2 % (ref 0–5)
HCT: 45.5 % (ref 39.0–52.0)
Hemoglobin: 15.6 g/dL (ref 13.0–17.0)
LYMPHS PCT: 17 % (ref 12–46)
Lymphs Abs: 1.3 10*3/uL (ref 0.7–4.0)
MCH: 30.6 pg (ref 26.0–34.0)
MCHC: 34.3 g/dL (ref 30.0–36.0)
MCV: 89.4 fL (ref 78.0–100.0)
MPV: 11.2 fL (ref 8.6–12.4)
Monocytes Absolute: 0.7 10*3/uL (ref 0.1–1.0)
Monocytes Relative: 9 % (ref 3–12)
NEUTROS PCT: 72 % (ref 43–77)
Neutro Abs: 5.6 10*3/uL (ref 1.7–7.7)
PLATELETS: 197 10*3/uL (ref 150–400)
RBC: 5.09 MIL/uL (ref 4.22–5.81)
RDW: 13.2 % (ref 11.5–15.5)
WBC: 7.8 10*3/uL (ref 4.0–10.5)

## 2014-07-10 LAB — URIC ACID: Uric Acid, Serum: 5.8 mg/dL (ref 4.0–7.8)

## 2014-07-10 LAB — COMPREHENSIVE METABOLIC PANEL
ALK PHOS: 60 U/L (ref 39–117)
ALT: 24 U/L (ref 0–53)
AST: 16 U/L (ref 0–37)
Albumin: 4.2 g/dL (ref 3.5–5.2)
BUN: 20 mg/dL (ref 6–23)
CALCIUM: 9.9 mg/dL (ref 8.4–10.5)
CO2: 29 meq/L (ref 19–32)
CREATININE: 1.21 mg/dL (ref 0.50–1.35)
Chloride: 99 mEq/L (ref 96–112)
Glucose, Bld: 83 mg/dL (ref 70–99)
Potassium: 4.2 mEq/L (ref 3.5–5.3)
SODIUM: 140 meq/L (ref 135–145)
TOTAL PROTEIN: 7 g/dL (ref 6.0–8.3)
Total Bilirubin: 0.6 mg/dL (ref 0.2–1.2)

## 2014-07-10 LAB — LIPID PANEL
CHOL/HDL RATIO: 3.6 ratio
Cholesterol: 178 mg/dL (ref 0–200)
HDL: 49 mg/dL (ref >39)
LDL Cholesterol: 111 mg/dL — ABNORMAL HIGH (ref 0–99)
Triglycerides: 92 mg/dL (ref <150)
VLDL: 18 mg/dL (ref 0–40)

## 2014-07-10 MED ORDER — CELECOXIB 200 MG PO CAPS: 200.0000 mg | ORAL_CAPSULE | Freq: Two times a day (BID) | ORAL | Status: DC

## 2014-07-10 NOTE — Progress Notes (Signed)
   Subjective:    Patient ID: Ronald Clark, male    DOB: 11/11/1975, 39 y.o.   MRN: [id number]  HPI He is here for evaluation of left ankle pain that started at the end of December. He was treated empirically with prednisone did not take it appropriately. He was then seen by Dr. Lajoyce Cornersuda and apparently x-rays were negative. He was told that it was probably gout and placed on allopurinol. The dosing is presumed to be 100 mg twice a day. Since he was started on that he has had at least one more episode of pain and swelling in the left ankle. At that time he also took the extra dosing of the prednisone that he did not take with the original encounter. He is interested in pursuing this further. He did have uric acid level drawn by Dr. Lajoyce Cornersuda. He presently has been on allopurinol for roughly 2 weeks. He does have a history of atrial fib and is wondering whether the allopurinol is potentially affecting this. He also complains of generalized malaise and fatigue. He does have a previous history of ulcers.   Review of Systems     Objective:   Physical Exam Alert and in no distress. Left ankle exam shows no redness, swelling or tenderness with full motion.       Assessment & Plan:  Left ankle pain - Plan: Uric Acid, celecoxib (CELEBREX) 200 MG capsule  Arthritis - Plan: Uric Acid, celecoxib (CELEBREX) 200 MG capsule  Malaise and fatigue - Plan: CBC with Differential/Platelet, Comprehensive metabolic panel, Lipid panel  History of peptic ulcer - Plan: CBC with Differential/Platelet, Comprehensive metabolic panel, Lipid panel, celecoxib (CELEBREX) 200 MG capsule  History of atrial fibrillation - Plan: CBC with Differential/Platelet, Comprehensive metabolic panel, Lipid panel  I discussed the fact that this is probably gout although cannot be definite without more information. He is not interested in taking allopurinol long-term and so I recommended that he stop and start taking Celebrex. I will do routine  blood work on him. If he has another breakthrough I will give him colchicine which will help us determine whether he truly has gout. Discussed treatment of uric acid especially if he has another attack and coverage to prevent another attack while we work on bringing his uric acid levels down.

## 2014-07-11 ENCOUNTER — Telehealth: Payer: Self-pay | Admitting: Family Medicine

## 2014-07-11 NOTE — Telephone Encounter (Signed)
P.A. Celebrex approved til 07/11/15, left message for pt, faxed pharmacy

## 2014-08-28 ENCOUNTER — Other Ambulatory Visit: Payer: Self-pay | Admitting: Family Medicine

## 2014-08-28 ENCOUNTER — Other Ambulatory Visit: Payer: Self-pay

## 2014-08-28 DIAGNOSIS — F410 Panic disorder [episodic paroxysmal anxiety] without agoraphobia: Secondary | ICD-10-CM

## 2014-08-28 DIAGNOSIS — Z566 Other physical and mental strain related to work: Secondary | ICD-10-CM

## 2014-08-28 MED ORDER — ALPRAZOLAM 0.5 MG PO TABS: ORAL_TABLET | ORAL | Status: DC

## 2014-10-11 ENCOUNTER — Other Ambulatory Visit: Payer: Self-pay | Admitting: Family Medicine

## 2014-10-11 MED ORDER — AZITHROMYCIN 500 MG PO TABS: 500.0000 mg | ORAL_TABLET | Freq: Every day | ORAL | Status: DC

## 2014-10-11 NOTE — Progress Notes (Signed)
He has a weeklong history of productive cough and malaise. He is getting ready to leave the country for one week. I will call in azithromycin.

## 2014-10-29 ENCOUNTER — Other Ambulatory Visit: Payer: Self-pay | Admitting: Family Medicine

## 2014-10-30 ENCOUNTER — Telehealth: Payer: Self-pay | Admitting: Family Medicine

## 2014-10-31 NOTE — Telephone Encounter (Signed)
Pt states he has taken Tums, and a couple Natural/Health remedies (PH balance & Usophageal? He was unsure of spelling) Sent in P.A.

## 2014-11-07 MED ORDER — RANITIDINE HCL 300 MG PO CAPS: 300.0000 mg | ORAL_CAPSULE | Freq: Every evening | ORAL | Status: DC

## 2014-11-07 NOTE — Telephone Encounter (Signed)
Let him know that his insurance would not allow him to have the medicine her stomach. I called a different medication in. Make sure he lets me know how it works

## 2014-11-07 NOTE — Telephone Encounter (Signed)
P.A. Gibson Rampenied, pt needs 30 day trial of either Ranitidine 300mg  or Famotidine 10mg   Do you want to switch?

## 2014-11-08 ENCOUNTER — Encounter: Payer: Self-pay | Admitting: Family Medicine

## 2014-11-08 NOTE — Telephone Encounter (Signed)
Called pt & he states he also has Barrett's esophagus and asked that we do appeal and add this diagnosis The appeal was faxed to [phone]

## 2014-11-22 NOTE — Telephone Encounter (Signed)
Called pharmacy & pantoprazole went thru for $12, left message for pt

## 2014-12-26 ENCOUNTER — Telehealth: Payer: Self-pay | Admitting: Family Medicine

## 2014-12-26 NOTE — Telephone Encounter (Signed)
I was unable to get a number or fax # for the appeals department so I faxed the documents and an new letter to faxed to [employer] at # [phone], [phone] and [phone] and also mailed it to [employer] and Grievance Dept [address] [address] [place], [place] [address]

## 2015-01-08 NOTE — Telephone Encounter (Signed)
Recv'd fax that P.A. was denied, but no response to appeal.  So called and refaxed appeal to t#[phone]

## 2015-01-10 NOTE — Telephone Encounter (Signed)
Recv'd letter of approval for pt's appeal for Pantoprazole for quanity limits.  Called pt and unable to leave message. Called pharmacy and not time for refill yet

## 2015-01-20 ENCOUNTER — Telehealth: Payer: Self-pay | Admitting: Family Medicine

## 2015-01-20 NOTE — Telephone Encounter (Signed)
Left message that appeal for Pantoprazole has now been approved til 01/06/16

## 2015-02-17 ENCOUNTER — Other Ambulatory Visit: Payer: Self-pay | Admitting: Family Medicine

## 2015-02-17 NOTE — Telephone Encounter (Signed)
IS THIS OKAY 

## 2015-04-06 ENCOUNTER — Other Ambulatory Visit: Payer: Self-pay | Admitting: Family Medicine

## 2015-04-09 ENCOUNTER — Telehealth: Payer: Self-pay | Admitting: Family Medicine

## 2015-04-09 NOTE — Telephone Encounter (Signed)
Received a call from [facility] concerning pt's recent hospital stay. Spoke to D.R. Horton, Incvonne Ridriquez, CMA and she made pt a hospital follow up visit on 04/18/2015. She also sent me records from pt's recent hospital stay. Pt was then called concerning his recent hospital stay. Pt was seen at the ER and admitted to the hospital for chest pain he was released 04/08/2015. Pt states that he is fine and having no issues. Pt states that no medications were changed, added and discontinued. Pt was reminded of his appt with JCL on 04/18/2015 and he verbalized that he would be there. Pt was informed that if he had and concerns or issues before scheduled appt to call the office. I am sending records back for JCL to review.

## 2015-04-18 ENCOUNTER — Ambulatory Visit: Payer: BC Managed Care – PPO | Admitting: Family Medicine

## 2015-08-08 ENCOUNTER — Encounter: Payer: Self-pay | Admitting: Family Medicine

## 2015-09-23 MED ORDER — COLCHICINE 0.6 MG PO TABS: 0.6000 mg | ORAL_TABLET | Freq: Every day | ORAL | Status: DC

## 2015-09-23 NOTE — Progress Notes (Unsigned)
   Subjective:    Patient ID: Ronald Clark, male    DOB: 03/19/1976, 40 y.o.   MRN: [id number]  HPI    Review of Systems     Objective:   Physical Exam        Assessment & Plan:  Called indicating difficulty with a gout attack. He has a history of apparently having this several times per year. He was seen in the past by Dr. Lajoyce Cornersuda and lysed probably on allopurinol and told to take this regularly however he has stopped. I will have him take ibuprofen 800 mg 3 times a day as well as colchicine 0.6 twice a day and return here in 2 weeks for recheck.

## 2015-12-12 ENCOUNTER — Ambulatory Visit (INDEPENDENT_AMBULATORY_CARE_PROVIDER_SITE_OTHER): Payer: BC Managed Care – PPO | Admitting: Family Medicine

## 2015-12-12 ENCOUNTER — Encounter: Payer: Self-pay | Admitting: Family Medicine

## 2015-12-12 ENCOUNTER — Other Ambulatory Visit: Payer: Self-pay

## 2015-12-12 VITALS — BP 114/76 | HR 61 | Ht 75.0 in | Wt 222.0 lb

## 2015-12-12 DIAGNOSIS — F410 Panic disorder [episodic paroxysmal anxiety] without agoraphobia: Secondary | ICD-10-CM

## 2015-12-12 DIAGNOSIS — Z630 Problems in relationship with spouse or partner: Secondary | ICD-10-CM

## 2015-12-12 MED ORDER — ALPRAZOLAM 0.5 MG PO TABS: ORAL_TABLET | ORAL | Status: DC

## 2015-12-12 MED ORDER — ZOLPIDEM TARTRATE 10 MG PO TABS: ORAL_TABLET | ORAL | Status: DC

## 2015-12-12 NOTE — Progress Notes (Signed)
   Subjective:    Patient ID: Ronald Clark, male    DOB: 11/01/1975, 40 y.o.   MRN: [id number]  HPI He is here for consult concerning marital stress. HEENT his wife are considering separation. She has been under a lot of stress dealing with recent hysterectomy as well as bilateral mastectomy to reduce her risk of cancer due to BRCA gene. Says been slowly getting worse over the last 6 months. In the last month or so he has been very dark place. He continues on Celexa. He rarely uses Xanax and has not used Ambien in quite some time. At one point did have some suicidal ideation but has verbally agreed to not go down that path. They're now involved in [place] marital counseling. He is also starting to see Ronald Clark again. He also admits to excessive alcohol consumption.  Review of Systems     Objective:   Physical Exam Alert and in no distress with appropriate affect.       Assessment & Plan:  Panic disorder - Plan: ALPRAZolam (XANAX) 0.5 MG tablet, zolpidem (AMBIEN) 10 MG tablet  Marital stress He did give me a verbal contract not do any harm to himself. I also encouraged him to avoid all alcohol. I will give him a small dose of Xanax and Ambien to help on an as-needed basis. Continue on Celexa at the present dosing. Encouraged him to continue with Ronald Clark. Also encouraged him to read the book called the 5 love languages. Also apparently his wife is having him monitored. We discussed this in detail. Also discussed potentially protecting himself from legal perspective if it becomes necessary to separate and possibly divorce. I will keep in touch with him concerning all these issues. Over 25 minutes with 100% spent in counseling and coordination of care

## 2015-12-31 ENCOUNTER — Ambulatory Visit
Admission: RE | Admit: 2015-12-31 | Discharge: 2015-12-31 | Disposition: A | Discharge status: Home / Self Care | Payer: BC Managed Care – PPO | Source: Ambulatory Visit | Attending: Family Medicine | Admitting: Family Medicine

## 2015-12-31 ENCOUNTER — Ambulatory Visit (INDEPENDENT_AMBULATORY_CARE_PROVIDER_SITE_OTHER): Payer: BC Managed Care – PPO | Admitting: Family Medicine

## 2015-12-31 VITALS — BP 110/60 | HR 58 | Resp 16 | Ht 74.0 in | Wt 223.0 lb

## 2015-12-31 DIAGNOSIS — M109 Gout, unspecified: Secondary | ICD-10-CM

## 2015-12-31 DIAGNOSIS — M10071 Idiopathic gout, right ankle and foot: Secondary | ICD-10-CM

## 2015-12-31 MED ORDER — ALLOPURINOL 300 MG PO TABS: 300.0000 mg | ORAL_TABLET | Freq: Every day | ORAL | 3 refills | Status: DC

## 2015-12-31 NOTE — Progress Notes (Signed)
   Subjective:    Patient ID: Ronald Clark, male    DOB: 02/21/1976, 40 y.o.   MRN: [id number]  HPI He is here for follow-up visit after recent gout attack. He describes exquisite pain, redness and swelling to the right great toe and to a lesser extent other toes. Because difficulty with his ankle and knee and up have and have this wrapped to help with the discomfort. He has had about 6 of these attacks in the last year and a half. He subsequently started taking allopurinol 200 mg daily. This was given to him by Dr. Lajoyce Corners in the past but he did not continue on this. He is not had x-rays on this foot.  Review of Systems     Objective:   Physical Exam  Exam of the right foot does show some swelling and minimal tenderness to palpation of the right first MTP joint.        Assessment & Plan:  Acute gout of right foot, unspecified cause - Plan: DG Foot Complete Right, allopurinol (ZYLOPRIM) 300 MG tablet I explained that since he has started taking allopurinol, we will continue with this and have him increase this to 300 mg. He is to return here in 6 weeks for follow-up on that. He is to continue on ibuprofen 800 mg 3 times a day until the pain is gone and then drop down to 400 mg daily explained he needs to do this to prevent a gout attack again. He is to return here in 6 weeks for blood work. At the end of the encounter then discussed the situation with his wife. He is seeing Ronald Clark to help with his underlying anxiety however can has referred him to a marriage counselor.

## 2015-12-31 NOTE — Patient Instructions (Signed)
Take to the allopurinol daily for the next several weeks then start on the 300 mg The only Advil 803 times per day until pain-free but then continue on 400 mg daily

## 2016-01-08 ENCOUNTER — Other Ambulatory Visit: Payer: Self-pay | Admitting: Family Medicine

## 2016-01-08 DIAGNOSIS — M1990 Unspecified osteoarthritis, unspecified site: Secondary | ICD-10-CM

## 2016-01-08 DIAGNOSIS — Z8711 Personal history of peptic ulcer disease: Secondary | ICD-10-CM

## 2016-01-08 DIAGNOSIS — M25572 Pain in left ankle and joints of left foot: Secondary | ICD-10-CM

## 2016-01-08 MED ORDER — COLCHICINE 0.6 MG PO TABS
0.6000 mg | ORAL_TABLET | Freq: Two times a day (BID) | ORAL | 1 refills | Status: AC
Start: 2016-01-08 — End: ?

## 2016-01-08 MED ORDER — CELECOXIB 200 MG PO CAPS: 200.0000 mg | ORAL_CAPSULE | Freq: Two times a day (BID) | ORAL | 1 refills | Status: DC

## 2016-01-08 NOTE — Progress Notes (Signed)
   Subjective:    Patient ID: Ronald Clark, male    DOB: 03-30-1976, 40 y.o.   MRN: [id number]  HPI    Review of Systems     Objective:   Physical Exam        Assessment & Plan:  He called indicating he is having worsening of his gout. I will switch him to call to seen twice a day for treatment and prevention. Will also give him Celebrex since he does have a history of ulcer disease.

## 2016-02-14 ENCOUNTER — Other Ambulatory Visit: Payer: Self-pay | Admitting: Family Medicine

## 2016-02-16 NOTE — Telephone Encounter (Signed)
Is this okay to refill? 

## 2016-02-26 ENCOUNTER — Encounter (HOSPITAL_COMMUNITY)
Admission: RE | Disposition: A | Discharge status: Home / Self Care | Payer: Self-pay | Source: Ambulatory Visit | Attending: Orthopedic Surgery

## 2016-02-26 ENCOUNTER — Ambulatory Visit (HOSPITAL_COMMUNITY): Payer: Worker's Compensation | Admitting: Anesthesiology

## 2016-02-26 ENCOUNTER — Other Ambulatory Visit (HOSPITAL_COMMUNITY): Payer: Self-pay | Admitting: Family

## 2016-02-26 ENCOUNTER — Encounter (HOSPITAL_COMMUNITY): Payer: Self-pay | Admitting: *Deleted

## 2016-02-26 ENCOUNTER — Ambulatory Visit (HOSPITAL_COMMUNITY)
Admission: RE | Admit: 2016-02-26 | Discharge: 2016-02-26 | Disposition: A | Discharge status: Home / Self Care | Payer: Worker's Compensation | Source: Ambulatory Visit | Attending: Orthopedic Surgery | Admitting: Orthopedic Surgery

## 2016-02-26 DIAGNOSIS — Y9366 Activity, soccer: Secondary | ICD-10-CM | POA: Diagnosis not present

## 2016-02-26 DIAGNOSIS — K219 Gastro-esophageal reflux disease without esophagitis: Secondary | ICD-10-CM | POA: Diagnosis not present

## 2016-02-26 DIAGNOSIS — Z8042 Family history of malignant neoplasm of prostate: Secondary | ICD-10-CM | POA: Diagnosis not present

## 2016-02-26 DIAGNOSIS — Y929 Unspecified place or not applicable: Secondary | ICD-10-CM | POA: Diagnosis not present

## 2016-02-26 DIAGNOSIS — S86012A Strain of left Achilles tendon, initial encounter: Secondary | ICD-10-CM | POA: Insufficient documentation

## 2016-02-26 DIAGNOSIS — Z79899 Other long term (current) drug therapy: Secondary | ICD-10-CM | POA: Diagnosis not present

## 2016-02-26 DIAGNOSIS — I4891 Unspecified atrial fibrillation: Secondary | ICD-10-CM | POA: Diagnosis not present

## 2016-02-26 DIAGNOSIS — X500XXA Overexertion from strenuous movement or load, initial encounter: Secondary | ICD-10-CM | POA: Diagnosis not present

## 2016-02-26 HISTORY — DX: Other specified postprocedural states: R11.2

## 2016-02-26 HISTORY — PX: ACHILLES TENDON SURGERY: SHX542

## 2016-02-26 HISTORY — DX: Other specified postprocedural states: Z98.890

## 2016-02-26 HISTORY — DX: Gastro-esophageal reflux disease without esophagitis: K21.9

## 2016-02-26 HISTORY — DX: Cardiac arrhythmia, unspecified: I49.9

## 2016-02-26 LAB — BASIC METABOLIC PANEL
ANION GAP: 9 (ref 5–15)
BUN: 16 mg/dL (ref 6–20)
CHLORIDE: 101 mmol/L (ref 101–111)
CO2: 27 mmol/L (ref 22–32)
Calcium: 9.9 mg/dL (ref 8.9–10.3)
Creatinine, Ser: 1.19 mg/dL (ref 0.61–1.24)
GFR calc Af Amer: >60 mL/min (ref >60)
GLUCOSE: 101 mg/dL — AB (ref 65–99)
POTASSIUM: 3.7 mmol/L (ref 3.5–5.1)
Sodium: 137 mmol/L (ref 135–145)

## 2016-02-26 LAB — CBC
HEMATOCRIT: 44.6 % (ref 39.0–52.0)
HEMOGLOBIN: 15 g/dL (ref 13.0–17.0)
MCH: 30.6 pg (ref 26.0–34.0)
MCHC: 33.6 g/dL (ref 30.0–36.0)
MCV: 91 fL (ref 78.0–100.0)
Platelets: 177 10*3/uL (ref 150–400)
RBC: 4.9 MIL/uL (ref 4.22–5.81)
RDW: 13.1 % (ref 11.5–15.5)
WBC: 8.2 10*3/uL (ref 4.0–10.5)

## 2016-02-26 SURGERY — REPAIR, TENDON, ACHILLES
Anesthesia: General | Site: Leg Lower | Laterality: Left

## 2016-02-26 MED ORDER — CEFAZOLIN SODIUM-DEXTROSE 2-4 GM/100ML-% IV SOLN
2.0000 g | INTRAVENOUS | Status: AC
  Administered 2016-02-26: 2 g via INTRAVENOUS

## 2016-02-26 MED ORDER — OXYCODONE-ACETAMINOPHEN 5-325 MG PO TABS: 1.0000 | ORAL_TABLET | ORAL | 0 refills | Status: DC | PRN

## 2016-02-26 MED ORDER — PROMETHAZINE HCL 25 MG/ML IJ SOLN: 6.2500 mg | INTRAMUSCULAR | Status: DC | PRN

## 2016-02-26 MED ORDER — SCOPOLAMINE 1 MG/3DAYS TD PT72
MEDICATED_PATCH | TRANSDERMAL | Status: AC
  Filled 2016-02-26: qty 1

## 2016-02-26 MED ORDER — LACTATED RINGERS IV SOLN: INTRAVENOUS | Status: DC

## 2016-02-26 MED ORDER — ROCURONIUM BROMIDE 10 MG/ML (PF) SYRINGE
PREFILLED_SYRINGE | INTRAVENOUS | Status: AC
  Filled 2016-02-26: qty 10

## 2016-02-26 MED ORDER — PROPOFOL 10 MG/ML IV BOLUS
INTRAVENOUS | Status: AC
  Filled 2016-02-26: qty 20

## 2016-02-26 MED ORDER — FENTANYL CITRATE (PF) 100 MCG/2ML IJ SOLN
INTRAMUSCULAR | Status: AC
  Administered 2016-02-26: 100 ug via INTRAVENOUS
  Filled 2016-02-26: qty 2

## 2016-02-26 MED ORDER — MIDAZOLAM HCL 2 MG/2ML IJ SOLN
1.0000 mg | INTRAMUSCULAR | Status: DC | PRN
  Administered 2016-02-26: 2 mg via INTRAVENOUS

## 2016-02-26 MED ORDER — MIDAZOLAM HCL 2 MG/2ML IJ SOLN
INTRAMUSCULAR | Status: AC
Start: 2016-02-26 — End: 2016-02-26
  Administered 2016-02-26: 2 mg via INTRAVENOUS
  Filled 2016-02-26: qty 2

## 2016-02-26 MED ORDER — LIDOCAINE 2% (20 MG/ML) 5 ML SYRINGE
INTRAMUSCULAR | Status: AC
  Filled 2016-02-26: qty 10

## 2016-02-26 MED ORDER — BUPIVACAINE-EPINEPHRINE (PF) 0.5% -1:200000 IJ SOLN
INTRAMUSCULAR | Status: DC | PRN
  Administered 2016-02-26: 30 mL via PERINEURAL

## 2016-02-26 MED ORDER — PROPOFOL 10 MG/ML IV BOLUS
INTRAVENOUS | Status: DC | PRN
  Administered 2016-02-26 (×2): 200 mg via INTRAVENOUS

## 2016-02-26 MED ORDER — MEPERIDINE HCL 25 MG/ML IJ SOLN: 6.2500 mg | INTRAMUSCULAR | Status: DC | PRN

## 2016-02-26 MED ORDER — FENTANYL CITRATE (PF) 100 MCG/2ML IJ SOLN
INTRAMUSCULAR | Status: AC
  Filled 2016-02-26: qty 2

## 2016-02-26 MED ORDER — ONDANSETRON HCL 4 MG/2ML IJ SOLN
INTRAMUSCULAR | Status: DC | PRN
  Administered 2016-02-26: 4 mg via INTRAVENOUS

## 2016-02-26 MED ORDER — CHLORHEXIDINE GLUCONATE 4 % EX LIQD: 60.0000 mL | Freq: Once | CUTANEOUS | Status: DC

## 2016-02-26 MED ORDER — ASPIRIN EC 325 MG PO TBEC: 325.0000 mg | DELAYED_RELEASE_TABLET | Freq: Every day | ORAL | 0 refills | Status: DC

## 2016-02-26 MED ORDER — ACETAMINOPHEN 325 MG PO TABS
650.0000 mg | ORAL_TABLET | Freq: Once | ORAL | Status: AC
  Administered 2016-02-26: 650 mg via ORAL

## 2016-02-26 MED ORDER — SCOPOLAMINE 1 MG/3DAYS TD PT72
MEDICATED_PATCH | TRANSDERMAL | Status: DC | PRN
  Administered 2016-02-26: 1 via TRANSDERMAL

## 2016-02-26 MED ORDER — FENTANYL CITRATE (PF) 100 MCG/2ML IJ SOLN
50.0000 ug | INTRAMUSCULAR | Status: DC | PRN
  Administered 2016-02-26: 100 ug via INTRAVENOUS

## 2016-02-26 MED ORDER — 0.9 % SODIUM CHLORIDE (POUR BTL) OPTIME
TOPICAL | Status: DC | PRN
  Administered 2016-02-26: 1000 mL

## 2016-02-26 MED ORDER — LIDOCAINE 2% (20 MG/ML) 5 ML SYRINGE
INTRAMUSCULAR | Status: DC | PRN
  Administered 2016-02-26: 100 mg via INTRAVENOUS

## 2016-02-26 MED ORDER — ONDANSETRON HCL 4 MG/2ML IJ SOLN
INTRAMUSCULAR | Status: AC
  Filled 2016-02-26: qty 2

## 2016-02-26 MED ORDER — ACETAMINOPHEN 325 MG PO TABS
ORAL_TABLET | ORAL | Status: AC
  Filled 2016-02-26: qty 2

## 2016-02-26 MED ORDER — LACTATED RINGERS IV SOLN
INTRAVENOUS | Status: DC
  Administered 2016-02-26: 16:00:00 via INTRAVENOUS

## 2016-02-26 MED ORDER — HYDROMORPHONE HCL 1 MG/ML IJ SOLN: 0.2500 mg | INTRAMUSCULAR | Status: DC | PRN

## 2016-02-26 MED ORDER — CEFAZOLIN SODIUM-DEXTROSE 2-4 GM/100ML-% IV SOLN
INTRAVENOUS | Status: AC
  Filled 2016-02-26: qty 100

## 2016-02-26 SURGICAL SUPPLY — 35 items
BNDG COHESIVE 6X5 TAN STRL LF (GAUZE/BANDAGES/DRESSINGS) ×2 IMPLANT
BNDG ESMARK 4X9 LF (GAUZE/BANDAGES/DRESSINGS) ×2 IMPLANT
BNDG GAUZE ELAST 4 BULKY (GAUZE/BANDAGES/DRESSINGS) ×2 IMPLANT
CANISTER SUCTION 2500CC (MISCELLANEOUS) ×2 IMPLANT
COVER SURGICAL LIGHT HANDLE (MISCELLANEOUS) ×4 IMPLANT
CUFF TOURNIQUET SINGLE 34IN LL (TOURNIQUET CUFF) IMPLANT
CUFF TOURNIQUET SINGLE 44IN (TOURNIQUET CUFF) IMPLANT
DRAPE U-SHAPE 47X51 STRL (DRAPES) ×2 IMPLANT
DRSG ADAPTIC 3X8 NADH LF (GAUZE/BANDAGES/DRESSINGS) ×2 IMPLANT
DRSG PAD ABDOMINAL 8X10 ST (GAUZE/BANDAGES/DRESSINGS) ×2 IMPLANT
DURAPREP 26ML APPLICATOR (WOUND CARE) ×2 IMPLANT
ELECT REM PT RETURN 9FT ADLT (ELECTROSURGICAL) ×2
ELECTRODE REM PT RTRN 9FT ADLT (ELECTROSURGICAL) ×1 IMPLANT
GAUZE SPONGE 4X4 12PLY STRL (GAUZE/BANDAGES/DRESSINGS) ×2 IMPLANT
GLOVE BIOGEL PI IND STRL 9 (GLOVE) ×1 IMPLANT
GLOVE BIOGEL PI INDICATOR 9 (GLOVE) ×1
GLOVE SURG ORTHO 9.0 STRL STRW (GLOVE) ×2 IMPLANT
GOWN STRL REUS W/ TWL XL LVL3 (GOWN DISPOSABLE) ×3 IMPLANT
GOWN STRL REUS W/TWL XL LVL3 (GOWN DISPOSABLE) ×3
KIT ROOM TURNOVER OR (KITS) ×2 IMPLANT
NDL SUT .5 MAYO 1.404X.05X (NEEDLE) ×1 IMPLANT
NEEDLE MAYO TAPER (NEEDLE) ×1
NS IRRIG 1000ML POUR BTL (IV SOLUTION) ×2 IMPLANT
PACK ORTHO EXTREMITY (CUSTOM PROCEDURE TRAY) ×2 IMPLANT
PAD ARMBOARD 7.5X6 YLW CONV (MISCELLANEOUS) ×4 IMPLANT
PADDING CAST COTTON 6X4 STRL (CAST SUPPLIES) ×2 IMPLANT
SPONGE LAP 18X18 X RAY DECT (DISPOSABLE) ×2 IMPLANT
SUT ETHILON 2 0 PSLX (SUTURE) ×2 IMPLANT
SUT FIBERWIRE #2 38 T-5 BLUE (SUTURE) ×8
SUT MON AB 2-0 CT1 36 (SUTURE) ×2 IMPLANT
SUTURE FIBERWR #2 38 T-5 BLUE (SUTURE) ×4 IMPLANT
TOWEL OR 17X24 6PK STRL BLUE (TOWEL DISPOSABLE) ×2 IMPLANT
TOWEL OR 17X26 10 PK STRL BLUE (TOWEL DISPOSABLE) ×2 IMPLANT
TUBE CONNECTING 12X1/4 (SUCTIONS) ×2 IMPLANT
YANKAUER SUCT BULB TIP NO VENT (SUCTIONS) ×2 IMPLANT

## 2016-02-26 NOTE — Anesthesia Procedure Notes (Signed)
Procedure Name: LMA Insertion Date/Time: 02/26/2016 6:26 PM Performed by: Charm BargesBUTLER, Quantarius Genrich R Pre-anesthesia Checklist: Patient identified, Emergency Drugs available, Suction available and Patient being monitored Patient Re-evaluated:Patient Re-evaluated prior to inductionOxygen Delivery Method: Circle System Utilized Preoxygenation: Pre-oxygenation with 100% oxygen Intubation Type: IV induction Ventilation: Mask ventilation without difficulty LMA: LMA inserted LMA Size: 5.0 Number of attempts: 1 Airway Equipment and Method: Bite block Placement Confirmation: positive ETCO2 Tube secured with: Tape Dental Injury: Teeth and Oropharynx as per pre-operative assessment

## 2016-02-26 NOTE — Transfer of Care (Signed)
Immediate Anesthesia Transfer of Care Note  Patient: [name]  Procedure(s) Performed: Procedure(s): ACHILLES TENDON REPAIR (Left)  Patient Location: PACU  Anesthesia Type:General and GA combined with regional for post-op pain  Level of Consciousness: awake, alert  and oriented  Airway & Oxygen Therapy: Patient Spontanous Breathing and Patient connected to nasal cannula oxygen  Post-op Assessment: Report given to RN and Post -op Vital signs reviewed and stable  Post vital signs: Reviewed and stable  Last Vitals:  Vitals:   02/26/16 1710 02/26/16 1715  BP: 130/63 124/60  Pulse: 63 71  Resp: 11 14  Temp:      Last Pain:  Vitals:   02/26/16 1655  TempSrc: Oral  PainSc:       Patients Stated Pain Goal: 5 (02/26/16 1544)  Complications: No apparent anesthesia complications

## 2016-02-26 NOTE — Anesthesia Procedure Notes (Signed)
Anesthesia Regional Block:  Popliteal block  Pre-Anesthetic Checklist: ,, timeout performed, Correct Patient, Correct Site, Correct Laterality, Correct Procedure, Correct Position, site marked, Risks and benefits discussed,  Surgical consent,  Pre-op evaluation,  At surgeon's request and post-op pain management  Laterality: Left  Prep: chloraprep       Needles:  Injection technique: Single-shot  Needle Type: Echogenic Needle     Needle Length: 9cm 9 cm Needle Gauge: 21 and 21 G    Additional Needles:  Procedures: ultrasound guided (picture in chart) Popliteal block Narrative:  Start time: 02/26/2016 4:30 PM End time: 02/26/2016 4:35 PM Injection made incrementally with aspirations every 5 mL.  Performed by: Personally  Anesthesiologist: Shona SimpsonHOLLIS, Lenya Sterne D  Additional Notes: Pt tolerated well. No immediate complications noted.

## 2016-02-26 NOTE — Anesthesia Postprocedure Evaluation (Signed)
Anesthesia Post Note  Patient: Ronald Clark  Procedure(s) Performed: Procedure(s) (LRB): ACHILLES TENDON REPAIR (Left)  Patient location during evaluation: PACU Anesthesia Type: General Level of consciousness: awake and alert Pain management: pain level controlled Vital Signs Assessment: post-procedure vital signs reviewed and stable Respiratory status: spontaneous breathing, nonlabored ventilation and respiratory function stable Cardiovascular status: blood pressure returned to baseline and stable Postop Assessment: no signs of nausea or vomiting Anesthetic complications: no    Last Vitals:  Vitals:   02/26/16 1913 02/26/16 1921  BP: 115/75   Pulse: 73 67  Resp: 18 16  Temp: 36.8 C     Last Pain:  Vitals:   02/26/16 1921  TempSrc:   PainSc: 0-No pain                 Jennfer Gassen A

## 2016-02-26 NOTE — H&P (Signed)
Ronald Clark is an 40 y.o. male.   Chief Complaint: Left Achilles tendon rupture. 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  Past Medical History:  Diagnosis Date  . Dysrhythmia    Atrial Fibrillation  . GERD (gastroesophageal reflux disease)   . PONV (postoperative nausea and vomiting)     Past Surgical History:  Procedure Laterality Date  . DIAGNOSTIC LAPAROSCOPY      Family History  Problem Relation Age of Onset  . Prostate cancer Father    Social History:  reports that he has never smoked. He has never used smokeless tobacco. He reports that he drinks about 7.2 oz of alcohol per week . His drug history is not on file.  Allergies: No Known Allergies  Medications Prior to Admission  Medication Sig Dispense Refill  . allopurinol (ZYLOPRIM) 300 MG tablet Take 1 tablet (300 mg total) by mouth daily. 90 tablet 3  . ALPRAZolam (XANAX) 0.5 MG tablet Take one tablet by mouth twice daily  for anxiety (Patient taking differently: Take 0.5 mg by mouth 2 (two) times daily as needed for anxiety. ) 30 tablet 0  . citalopram (CELEXA) 40 MG tablet TAKE ONE TABLET BY MOUTH ONE TIME DAILY (Patient taking differently: Take 40 mg by mouth in the morning) 90 tablet 2  . colchicine 0.6 MG tablet Take 1 tablet (0.6 mg total) by mouth 2 (two) times daily. (Patient taking differently: Take 0.6 mg by mouth every morning. ) 60 tablet 1  . flecainide (TAMBOCOR) 150 MG tablet Take 150 mg by mouth 2 (two) times daily.     Marland Kitchen LORazepam (ATIVAN) 0.5 MG tablet Take one tablet by mouth twice daily as needed  for anxiety (Patient taking differently: Take 0.5 mg by mouth two times a day as needed for anxiety) 30 tablet 1  . magnesium oxide (MAG-OX) 400 MG tablet Take 400 mg by mouth every morning.    . pantoprazole (PROTONIX) 40 MG tablet TAKE ONE TABLET BY MOUTH ONE TIME DAILY (Patient taking  differently: Take 40 mg by mouth in the morning) 30 tablet 3  . zolpidem (AMBIEN) 10 MG tablet Take 1 tablet by mouth at bed time as needed for sleep (Patient taking differently: Take 10 mg by mouth at bedtime as needed for sleep. ) 30 tablet 0  . celecoxib (CELEBREX) 200 MG capsule Take 1 capsule (200 mg total) by mouth 2 (two) times daily. (Patient not taking: Reported on 02/26/2016) 60 capsule 1  . ranitidine (ZANTAC) 300 MG capsule Take 1 capsule (300 mg total) by mouth every evening. (Patient not taking: Reported on 02/26/2016) 30 capsule 5    Results for orders placed or performed during the hospital encounter of 02/26/16 (from the past 48 hour(s))  Basic metabolic panel     Status: Abnormal   Collection Time: 02/26/16  3:54 PM  Result Value Ref Range   Sodium 137 135 - 145 mmol/L   Potassium 3.7 3.5 - 5.1 mmol/L   Chloride 101 101 - 111 mmol/L   CO2 27 22 - 32 mmol/L   Glucose, Bld 101 (H) 65 - 99 mg/dL   BUN 16 6 - 20 mg/dL   Creatinine, Ser 1.19 0.61 - 1.24 mg/dL   Calcium 9.9 8.9 - 10.3 mg/dL   GFR calc non Af Amer >60 >60 mL/min   GFR calc Af Amer >60 >60  mL/min    Comment: (NOTE) The eGFR has been calculated using the CKD EPI equation. This calculation has not been validated in all clinical situations. eGFR's persistently <60 mL/min signify possible Chronic Kidney Disease.    Anion gap 9 5 - 15  CBC     Status: None   Collection Time: 02/26/16  3:54 PM  Result Value Ref Range   WBC 8.2 4.0 - 10.5 K/uL   RBC 4.90 4.22 - 5.81 MIL/uL   Hemoglobin 15.0 13.0 - 17.0 g/dL   HCT 44.6 39.0 - 52.0 %   MCV 91.0 78.0 - 100.0 fL   MCH 30.6 26.0 - 34.0 pg   MCHC 33.6 30.0 - 36.0 g/dL   RDW 13.1 11.5 - 15.5 %   Platelets 177 150 - 400 K/uL   No results found.  Review of Systems  All other systems reviewed and are negative.   Blood pressure 124/60, pulse 71, temperature 98.6 F (37 C), temperature source Oral, resp. rate 14, SpO2 99 %. Physical Exam  Patient has a good  dorsalis pedis pulse there is no skin breakdown or lesions. Ultrasound shows complete rupture of Achilles tendon. Compression the calf does not reproduce any plantarflexion. Assessment/Plan Assessment: Complete rupture left Achilles tendon.  Plan: Plan for Achilles tendon reconstruction. Risk and benefits of operative versus nonoperative treatment were discussed patient states he understands wishes to proceed at this time.  Newt Minion, MD 02/26/2016, 6:14 PM

## 2016-02-26 NOTE — Op Note (Signed)
02/26/2016  7:06 PM  PATIENT:  Ronald Clark    PRE-OPERATIVE DIAGNOSIS:  Left Achilles Rupture  POST-OPERATIVE DIAGNOSIS:  Same  PROCEDURE:  ACHILLES TENDON REPAIR  SURGEON:  Nadara MustardMarcus V Duda, MD  PHYSICIAN ASSISTANT:None ANESTHESIA:   General  PREOPERATIVE INDICATIONS:  Ronald Clark is a  40 y.o. male with a diagnosis of Left Achilles Rupture who failed conservative measures and elected for surgical management.    The risks benefits and alternatives were discussed with the patient preoperatively including but not limited to the risks of infection, bleeding, nerve injury, cardiopulmonary complications, the need for revision surgery, among others, and the patient was willing to proceed.  OPERATIVE IMPLANTS: None  OPERATIVE FINDINGS: Rupture at the musculotendinous junction  OPERATIVE PROCEDURE: Patient brought to operating room after undergoing a popliteal block he then underwent a general anesthetic. After adequate levels anesthesia obtained patient's left lower extremity was prepped using DuraPrep draped into a sterile field a timeout was called. A posterior medial longitudinal incision was made over the ruptured site. This was carried down to the peritenon which was incised. The Achilles was completely ruptured. Using #2 FiberWire and a Krakw technique 2 #2 fiber wires were  woven through the proximal stump and 2 #2 fiber wires were  woven through the distal stump. 4 strands exited both sides the foot was plantar flexed and the rupture was repaired without excessive plantar flexion. The wound was irrigated with normal saline. Patient's ankle was placed through range of motion and good range of motion with continuity of the repair. The peritenon was closed using 2-0 Monocryl the skin was closed using metallic hour did not a suture technique no suture across the incision. The wound was covered with a sterile dressing a posterior and sugar tong splint was applied patient was extubated taken  the PACU in stable condition.

## 2016-02-26 NOTE — Anesthesia Preprocedure Evaluation (Addendum)
Anesthesia Evaluation  Patient identified by MRN, date of birth, ID band Patient awake    Reviewed: Allergy & Precautions, NPO status , Patient's Chart, lab work & pertinent test results  History of Anesthesia Complications (+) PONV and history of anesthetic complications  Airway Mallampati: II  TM Distance: >3 FB Neck ROM: Full    Dental  (+) Teeth Intact, Dental Advisory Given   Pulmonary neg pulmonary ROS,    breath sounds clear to auscultation       Cardiovascular + dysrhythmias Atrial Fibrillation  Rhythm:Regular Rate:Normal     Neuro/Psych PSYCHIATRIC DISORDERS Anxiety negative neurological ROS     GI/Hepatic Neg liver ROS, GERD  Medicated,  Endo/Other  negative endocrine ROS  Renal/GU negative Renal ROS  negative genitourinary   Musculoskeletal negative musculoskeletal ROS (+)   Abdominal   Peds negative pediatric ROS (+)  Hematology negative hematology ROS (+)   Anesthesia Other Findings   Reproductive/Obstetrics negative OB ROS                            Anesthesia Physical Anesthesia Plan  ASA: III  Anesthesia Plan: General   Post-op Pain Management: GA combined w/ Regional for post-op pain   Induction: Intravenous  Airway Management Planned: Oral ETT  Additional Equipment:   Intra-op Plan:   Post-operative Plan: Extubation in OR  Informed Consent: I have reviewed the patients History and Physical, chart, labs and discussed the procedure including the risks, benefits and alternatives for the proposed anesthesia with the patient or authorized representative who has indicated his/her understanding and acceptance.   Dental advisory given  Plan Discussed with: CRNA  Anesthesia Plan Comments:        Anesthesia Quick Evaluation

## 2016-02-27 ENCOUNTER — Encounter (HOSPITAL_COMMUNITY): Payer: Self-pay | Admitting: Orthopedic Surgery

## 2016-03-11 ENCOUNTER — Inpatient Hospital Stay (INDEPENDENT_AMBULATORY_CARE_PROVIDER_SITE_OTHER): Payer: BC Managed Care – PPO | Admitting: Orthopedic Surgery

## 2016-03-11 DIAGNOSIS — M66362 Spontaneous rupture of flexor tendons, left lower leg: Secondary | ICD-10-CM

## 2016-03-25 ENCOUNTER — Ambulatory Visit (INDEPENDENT_AMBULATORY_CARE_PROVIDER_SITE_OTHER): Payer: BC Managed Care – PPO | Admitting: Orthopedic Surgery

## 2016-03-25 DIAGNOSIS — M66362 Spontaneous rupture of flexor tendons, left lower leg: Secondary | ICD-10-CM

## 2016-04-12 ENCOUNTER — Encounter (INDEPENDENT_AMBULATORY_CARE_PROVIDER_SITE_OTHER): Payer: Self-pay | Admitting: Orthopedic Surgery

## 2016-04-12 ENCOUNTER — Ambulatory Visit (INDEPENDENT_AMBULATORY_CARE_PROVIDER_SITE_OTHER): Payer: Worker's Compensation | Admitting: Orthopedic Surgery

## 2016-04-12 VITALS — Ht 74.0 in | Wt 223.0 lb

## 2016-04-12 DIAGNOSIS — S86012D Strain of left Achilles tendon, subsequent encounter: Secondary | ICD-10-CM | POA: Insufficient documentation

## 2016-04-12 NOTE — Progress Notes (Signed)
   Post-Op Visit Note   Patient: Ronald FennelJustin Kuznicki           Date of Birth: 10/01/1975           MRN: [id number] Visit Date: 04/12/2016 PCP: Carollee HerterLALONDE,JOHN CHARLES, MD   Assessment & Plan: Patient is a 89 old gentleman who is 6 weeks status post Achilles tendon reconstruction plan to continue the fracture boot with 2 heel lifts for 2 weeks advance to a sneaker with 1 heel lift follow-up in 3 weeks  Chief Complaint:  Chief Complaint  Patient presents with  . Left Ankle - Routine Post Op  . Routine Post Op    Right achilles reconstruction on 03/01/16    HPI: Patient presents today for follow up right achilles reconstruction. He is currently 6 weeks post op. He is ambulating with a cane and fracture boot. He is wearing vive compression stocking. His incision is well healed. There is no drainage. Overall he is doing well.  Visit Diagnoses:  1. Achilles rupture, left, subsequent encounter     Plan: Follow-up in 3 weeks. Continue with physical therapy with Ellamae SiaJohn O Halloran.  Follow-Up Instructions: Return in about 3 weeks (around 05/03/2016).   Orders:  No orders of the defined types were placed in this encounter.  No orders of the defined types were placed in this encounter.    PMFS History: Patient Active Problem List   Diagnosis Date Noted  . Achilles rupture, left, subsequent encounter 04/12/2016  . History of peptic ulcer 07/10/2014  . History of atrial fibrillation 07/10/2014  . Panic disorder 08/28/2012  . GERD (gastroesophageal reflux disease) 11/12/2010  . HERNIA, UMBILICAL 12/12/2008   Past Medical History:  Diagnosis Date  . Dysrhythmia    Atrial Fibrillation  . GERD (gastroesophageal reflux disease)   . PONV (postoperative nausea and vomiting)     Family History  Problem Relation Age of Onset  . Prostate cancer Father     Past Surgical History:  Procedure Laterality Date  . ACHILLES TENDON SURGERY Left 02/26/2016   Procedure: ACHILLES TENDON REPAIR;  Surgeon:  Nadara MustardMarcus V Duda, MD;  Location: [facility] OR;  Service: Orthopedics;  Laterality: Left;  . DIAGNOSTIC LAPAROSCOPY     Social History   Occupational History  . Not on file.   Social History Main Topics  . Smoking status: Never Smoker  . Smokeless tobacco: Never Used  . Alcohol use 7.2 oz/week    12 Cans of beer per week  . Drug use: Unknown  . Sexual activity: Not on file

## 2016-04-16 ENCOUNTER — Telehealth (INDEPENDENT_AMBULATORY_CARE_PROVIDER_SITE_OTHER): Payer: Self-pay | Admitting: Orthopedic Surgery

## 2016-04-16 NOTE — Telephone Encounter (Signed)
Ronald Clark with [employer] called needing office and work notes faxed to her. The fax# is  [phone]  The ph# is [phone]

## 2016-04-19 NOTE — Telephone Encounter (Signed)
Last office note faxed in quick disclosure.

## 2016-04-20 ENCOUNTER — Ambulatory Visit (INDEPENDENT_AMBULATORY_CARE_PROVIDER_SITE_OTHER): Payer: BC Managed Care – PPO | Admitting: Family Medicine

## 2016-04-20 ENCOUNTER — Encounter: Payer: Self-pay | Admitting: Family Medicine

## 2016-04-20 VITALS — BP 94/60 | HR 63 | Temp 98.1°F | Wt 225.0 lb

## 2016-04-20 DIAGNOSIS — J029 Acute pharyngitis, unspecified: Secondary | ICD-10-CM | POA: Diagnosis not present

## 2016-04-20 DIAGNOSIS — B079 Viral wart, unspecified: Secondary | ICD-10-CM | POA: Diagnosis not present

## 2016-04-20 DIAGNOSIS — Z630 Problems in relationship with spouse or partner: Secondary | ICD-10-CM

## 2016-04-20 NOTE — Progress Notes (Signed)
   Subjective:    Patient ID: Ronald FennelJustin Clark, male    DOB: 04/29/1976, 40 y.o.   MRN: [id number]  HPI He complains of a four-day history of started with sore throat followed by slight headache and fatigue but no earache, fever, chills or coughing. Also has warts present on his anterior chest that he would like removed. He is considering stopping his Celexa stating he and his wife have reached then passed and he is ready to move on.   Review of Systems     Objective:   Physical Exam Alert and in no distress. Tympanic membranes and canals are normal. Pharyngeal area is normal. Neck is supple without adenopathy or thyromegaly. Cardiac exam shows a regular sinus rhythm without murmurs or gallops. Lungs are clear to auscultation. Strep screen is negative. 3 less than 0.5 cm warts are present on the anterior chest.       Assessment & Plan:  Acute pharyngitis, unspecified etiology - Plan: Rapid Strep A  Viral warts, unspecified type  Marital stress Abortive care for the sore throat. The warts were frozen without difficulty. He is instructed on proper care for this. We also then discussed the marital stress and I recommended that we keep him on the present medication through the holidays and possibly stop it next spring. He is comfortable with this.

## 2016-04-21 LAB — POCT RAPID STREP A (OFFICE): RAPID STREP A SCREEN: NEGATIVE

## 2016-05-03 ENCOUNTER — Ambulatory Visit (INDEPENDENT_AMBULATORY_CARE_PROVIDER_SITE_OTHER): Payer: Self-pay | Admitting: Orthopedic Surgery

## 2016-05-04 ENCOUNTER — Other Ambulatory Visit: Payer: Self-pay | Admitting: Family Medicine

## 2016-05-04 MED ORDER — AZITHROMYCIN 500 MG PO TABS: 500.0000 mg | ORAL_TABLET | Freq: Every day | ORAL | 0 refills | Status: DC

## 2016-06-10 ENCOUNTER — Telehealth (INDEPENDENT_AMBULATORY_CARE_PROVIDER_SITE_OTHER): Payer: Self-pay | Admitting: Orthopedic Surgery

## 2016-06-11 ENCOUNTER — Other Ambulatory Visit (INDEPENDENT_AMBULATORY_CARE_PROVIDER_SITE_OTHER): Payer: Self-pay

## 2016-06-11 NOTE — Telephone Encounter (Signed)
The pt has not been in the office since 04/12/16 he was to continue with physical therapy and eturn in three weeks. The pt did not keep his last appt and there is nothing on the schedule for him.  I faxed this information to his case manager.  To call with questions.

## 2016-08-17 ENCOUNTER — Encounter (INDEPENDENT_AMBULATORY_CARE_PROVIDER_SITE_OTHER): Payer: Self-pay | Admitting: Orthopedic Surgery

## 2016-08-17 ENCOUNTER — Ambulatory Visit (INDEPENDENT_AMBULATORY_CARE_PROVIDER_SITE_OTHER): Payer: Worker's Compensation | Admitting: Orthopedic Surgery

## 2016-08-17 VITALS — Ht 74.0 in | Wt 225.0 lb

## 2016-08-17 DIAGNOSIS — S86012D Strain of left Achilles tendon, subsequent encounter: Secondary | ICD-10-CM

## 2016-08-17 MED ORDER — NITROGLYCERIN 0.2 MG/HR TD PT24: 0.2000 mg | MEDICATED_PATCH | Freq: Every day | TRANSDERMAL | 12 refills | Status: DC

## 2016-08-17 NOTE — Progress Notes (Signed)
   Office Visit Note   Patient: Ronald FennelJustin Guido           Date of Birth: 01/29/1976           MRN: [id number] Visit Date: 08/17/2016              Requested by: Ronnald NianJohn C Lalonde, MD [address] [place], [place] [id number] PCP: Carollee HerterLALONDE,JOHN CHARLES, MD  Chief Complaint  Patient presents with  . Left Ankle - Follow-up    Left achilles tendon repair 02/26/16    HPI: Patient is a 41 y.o male who presents today for left achilles reconstruction done 02/26/16. He had missed prior appointment and is following up to see how he is doing. He states he is able to run, and does not have to take pain medication. Donalee CitrinStepheney L Peele, RT  Patient states that he's been out running without complications. Denies any pain with walking.  Assessment & Plan: Visit Diagnoses:  1. Achilles rupture, left, subsequent encounter     Plan: Recommended strengthening with close chain kinetic exercises no dynamic exercises. Recommended scar massage for a year. Prescription for nitroglycerin patch called in to help improve microcirculation.  Follow-Up Instructions: Return if symptoms worsen or fail to improve.   Ortho Exam On examination patient has some thickening of the Achilles tendon his tendon is intact. Has good range of motion the ankle slight keloiding scar some mild tendinitis over the distal aspect of the Achilles. ROS: Negative for fever or chills. Imaging: No results found.  Labs: Lab Results  Component Value Date   LABURIC 5.8 07/10/2014    Orders:  No orders of the defined types were placed in this encounter.  Meds ordered this encounter  Medications  . nitroGLYCERIN (NITRODUR - DOSED IN MG/24 HR) 0.2 mg/hr patch    Sig: Place 1 patch (0.2 mg total) onto the skin daily.    Dispense:  30 patch    Refill:  12     Procedures: No procedures performed  Clinical Data: No additional findings.  Subjective: Review of Systems  Objective: Vital Signs: Ht 6\' 2"  (1.88 m)   Wt 225 lb  (102.1 kg)   BMI 28.89 kg/m   Specialty Comments:  No specialty comments available.  PMFS History: Patient Active Problem List   Diagnosis Date Noted  . Achilles rupture, left, subsequent encounter 04/12/2016  . History of peptic ulcer 07/10/2014  . History of atrial fibrillation 07/10/2014  . Panic disorder 08/28/2012  . GERD (gastroesophageal reflux disease) 11/12/2010  . HERNIA, UMBILICAL 12/12/2008   Past Medical History:  Diagnosis Date  . Dysrhythmia    Atrial Fibrillation  . GERD (gastroesophageal reflux disease)   . PONV (postoperative nausea and vomiting)     Family History  Problem Relation Age of Onset  . Prostate cancer Father     Past Surgical History:  Procedure Laterality Date  . ACHILLES TENDON SURGERY Left 02/26/2016   Procedure: ACHILLES TENDON REPAIR;  Surgeon: Nadara MustardMarcus V Timi Reeser, MD;  Location: [facility] OR;  Service: Orthopedics;  Laterality: Left;  . DIAGNOSTIC LAPAROSCOPY     Social History   Occupational History  . Not on file.   Social History Main Topics  . Smoking status: Never Smoker  . Smokeless tobacco: Never Used  . Alcohol use 7.2 oz/week    12 Cans of beer per week  . Drug use: Unknown  . Sexual activity: Not on file

## 2016-09-01 ENCOUNTER — Telehealth (INDEPENDENT_AMBULATORY_CARE_PROVIDER_SITE_OTHER): Payer: Self-pay

## 2016-09-01 NOTE — Telephone Encounter (Signed)
Received vm requesting the last office note on this work comp pt. Faxed the 08/17/16 note to [phone]

## 2016-09-28 ENCOUNTER — Encounter (INDEPENDENT_AMBULATORY_CARE_PROVIDER_SITE_OTHER): Payer: Self-pay | Admitting: Orthopedic Surgery

## 2016-09-28 ENCOUNTER — Ambulatory Visit (INDEPENDENT_AMBULATORY_CARE_PROVIDER_SITE_OTHER): Payer: Worker's Compensation | Admitting: Orthopedic Surgery

## 2016-09-28 VITALS — Ht 74.0 in | Wt 225.0 lb

## 2016-09-28 DIAGNOSIS — S86012D Strain of left Achilles tendon, subsequent encounter: Secondary | ICD-10-CM

## 2016-09-28 NOTE — Progress Notes (Signed)
Office Visit Note   Patient: Ronald Clark           Date of Birth: February 11, 1976           MRN: [id number] Visit Date: 09/28/2016              Requested by: Ronnald Nian, MD [address] [place], [place] [address] PCP: Carollee Herter, MD  Chief Complaint  Patient presents with  . Left Foot - Follow-up    02/2016 achilles tendon recon      HPI: Patient presents 7 months status post left Achilles tendon reconstruction. Patient is currently doing physical therapy which is helping he's having a little bit of impingement syndrome symptoms anterior medial joint line of the ankle. He has not been using the nitroglycerin patch she was worried of headaches. Patient is currently been doing physical therapy 2 times a week.  Assessment & Plan: Visit Diagnoses:  1. Achilles rupture, left, subsequent encounter     Plan: We'll have him continue physical therapy 2 times a week through the end of May follow-up at that time at which time he will be at [facility]. Patient will continue work without restrictions. Recommended a trial of the nitroglycerin patch recommended scar massage the patient has not been doing  Follow-Up Instructions: Return in about 4 weeks (around 10/26/2016).   Ortho Exam  Patient is alert, oriented, no adenopathy, well-dressed, normal affect, normal respiratory effort. Examination patient has an antalgic gait he has good dorsiflexion of the ankle good pulses he does have some slight keloiding of the scar. He has some insertional Achilles tendinitis which is tender to palpation there is no palpable defect of the tendon there is no cellulitis there is no dystrophic changes no signs of any insufficiency of the tendon.  Imaging: No results found.  Labs: Lab Results  Component Value Date   LABURIC 5.8 07/10/2014    Orders:  No orders of the defined types were placed in this encounter.  No orders of the defined types were placed in this encounter.    Procedures: No procedures performed  Clinical Data: No additional findings.  ROS:  All other systems negative, except as noted in the HPI. Review of Systems  Objective: Vital Signs: Ht  (1.88 m)   Wt 225 lb (102.1 kg)   BMI 28.89 kg/m   Specialty Comments:  No specialty comments available.  PMFS History: Patient Active Problem List   Diagnosis Date Noted  . Achilles rupture, left, subsequent encounter 04/12/2016  . History of peptic ulcer 07/10/2014  . History of atrial fibrillation 07/10/2014  . Panic disorder 08/28/2012  . GERD (gastroesophageal reflux disease) 11/12/2010  . HERNIA, UMBILICAL 12/12/2008   Past Medical History:  Diagnosis Date  . Dysrhythmia    Atrial Fibrillation  . GERD (gastroesophageal reflux disease)   . PONV (postoperative nausea and vomiting)     Family History  Problem Relation Age of Onset  . Prostate cancer Father     Past Surgical History:  Procedure Laterality Date  . ACHILLES TENDON SURGERY Left 02/26/2016   Procedure: ACHILLES TENDON REPAIR;  Surgeon: Nadara Mustard, MD;  Location: [facility] OR;  Service: Orthopedics;  Laterality: Left;  . DIAGNOSTIC LAPAROSCOPY     Social History   Occupational History  . Not on file.   Social History Main Topics  . Smoking status: Never Smoker  . Smokeless tobacco: Never Used  . Alcohol use 7.2 oz/week    12 Cans of  beer per week  . Drug use: Unknown  . Sexual activity: Not on file

## 2016-10-07 ENCOUNTER — Other Ambulatory Visit: Payer: Self-pay | Admitting: Family Medicine

## 2016-10-07 MED ORDER — PANTOPRAZOLE SODIUM 40 MG PO TBEC: 40.0000 mg | DELAYED_RELEASE_TABLET | Freq: Every day | ORAL | 3 refills | Status: DC

## 2016-11-04 ENCOUNTER — Ambulatory Visit (INDEPENDENT_AMBULATORY_CARE_PROVIDER_SITE_OTHER): Payer: BC Managed Care – PPO | Admitting: Family Medicine

## 2016-11-04 ENCOUNTER — Encounter (INDEPENDENT_AMBULATORY_CARE_PROVIDER_SITE_OTHER): Payer: Self-pay | Admitting: Orthopedic Surgery

## 2016-11-04 ENCOUNTER — Encounter: Payer: Self-pay | Admitting: Family Medicine

## 2016-11-04 ENCOUNTER — Ambulatory Visit (INDEPENDENT_AMBULATORY_CARE_PROVIDER_SITE_OTHER): Payer: Worker's Compensation | Admitting: Orthopedic Surgery

## 2016-11-04 VITALS — BP 100/60 | HR 60 | Wt 228.0 lb

## 2016-11-04 VITALS — Ht 74.0 in | Wt 225.0 lb

## 2016-11-04 DIAGNOSIS — S86012D Strain of left Achilles tendon, subsequent encounter: Secondary | ICD-10-CM

## 2016-11-04 DIAGNOSIS — J029 Acute pharyngitis, unspecified: Secondary | ICD-10-CM

## 2016-11-04 DIAGNOSIS — Z13220 Encounter for screening for lipoid disorders: Secondary | ICD-10-CM | POA: Diagnosis not present

## 2016-11-04 DIAGNOSIS — F40243 Fear of flying: Secondary | ICD-10-CM | POA: Diagnosis not present

## 2016-11-04 DIAGNOSIS — F410 Panic disorder [episodic paroxysmal anxiety] without agoraphobia: Secondary | ICD-10-CM | POA: Diagnosis not present

## 2016-11-04 LAB — POCT RAPID STREP A (OFFICE): Rapid Strep A Screen: NEGATIVE

## 2016-11-04 LAB — LIPID PANEL
CHOL/HDL RATIO: 4.2 ratio (ref <5.0)
Cholesterol: 182 mg/dL (ref <200)
HDL: 43 mg/dL (ref >40)
LDL CALC: 107 mg/dL — AB (ref <100)
Triglycerides: 158 mg/dL — ABNORMAL HIGH (ref <150)
VLDL: 32 mg/dL — AB (ref <30)

## 2016-11-04 MED ORDER — ZOLPIDEM TARTRATE 10 MG PO TABS: ORAL_TABLET | ORAL | 0 refills | Status: DC

## 2016-11-04 MED ORDER — LORAZEPAM 0.5 MG PO TABS: 0.5000 mg | ORAL_TABLET | Freq: Two times a day (BID) | ORAL | 1 refills | Status: DC | PRN

## 2016-11-04 NOTE — Progress Notes (Signed)
Subjective:     Patient ID: Ronald Clark, male   DOB: 02/12/1976, 41 y.o.   MRN: [id number]  HPI Ronald Clark is a 41 year old male who presents today with a two day history of sore throat. He also had chills and body aches last night, but did not take his temperature. He noticed this morning that his neck was swollen and painful bilaterally as well. He reports he has had a lymph node swollen on the right side for years that has been previously evaluated, but now it is tender. He hasn't had any nausea, vomiting, diarrhea, or constipation. His wife and son recently had a viral illness with diarrhea in the past week.  The patient is also interested in getting blood work done today because he hasn't gotten any done in awhile.   The patient also has a history of panic disorder noting difficulty with flights and also increased anxiety during the summer months because of soccer camps and increased traveling. He is interested in getting his Xanax and Ambien refilled for this. He typically uses this 1-2 times per month.   We also discussed his marriage and home life. He reports this is still an ongoing issue and something he is trying to deal with. He has tried counseling without much benefit.  Review of Systems Pertinent positives and negatives included in the subjective above.    Objective:   Physical Exam Alert and in no distress Right ear canal was without erythema. Right TM showed some scarring on the tympanic membrane. Left ear canal was normal and non-erythematous. Left TM was normal without erythema or bulging. Throat was non-erythematous and without exudate.  Neck had cervical lymphadenopathy bilaterally with tenderness to palpation bilaterally. Heart was regular rate and rhythm without murmurs, rubs, or gallops. Lungs were clear to auscultation bilaterally.  Strep test was negative.    Assessment and Plan:     Sore throat - Plan: POCT rapid strep A  Screening for lipid disorders -  Plan: Lipid panel  Panic disorder - Plan: zolpidem (AMBIEN) 10 MG tablet, LORazepam (ATIVAN) 0.5 MG tablet  Fear of flying - Plan: LORazepam (ATIVAN) 0.5 MG tablet  1. Sore Throat: We performed a strep test and it was negative. His symptoms are most consistent with viral pharyngitis. We encouraged symptom management and advised him to return if he didn't continue to get better. 2. Screening for lipid disorders: The patient's last lipid panel was in 2016. At that time, his LDL was mildly elevated. We will get a lipid panel today to reevaluate this.  3. Panic disorder/Fear of flying: The patient has a history of panic disorder and fear of flying. He typically uses Ambien and Ativan 1-2 times per month.  Given his upcoming travel and increased stress with soccer camps, we will refill this medication.  I agree with history and exam. JL Patient was seen in conjunction with Dr. Susann GivensLalonde. Tessie Fass-Mackenzie Davis, MS3

## 2016-11-04 NOTE — Progress Notes (Signed)
   Office Visit Note   Patient: Ronald FennelJustin Clark           Date of Birth: 01/31/1976           MRN: [id number] Visit Date: 11/04/2016              Requested by: Ronnald NianLalonde, John C, MD [address] [place], [place] [id number] PCP: Ronnald NianLalonde, John C, MD  Chief Complaint  Patient presents with  . Left Ankle - Follow-up    Achilles recon 02/2016      HPI: Patient is a 41 year old gentleman presents status post left Achilles tendon reconstruction he has had some tendinitis he does use nitroglycerin patches which did provide a headache he has undergone scar massage and physical therapy 2 times a week with good improvement.  Assessment & Plan: Visit Diagnoses:  1. Achilles rupture, left, subsequent encounter     Plan: Plan: I recommended continuing increase his activities as tolerated continue scar massage follow-up as needed. Discussed that he could try CBD oil or could use a quarter of the nitroglycerin patch she develops recurrent tendinitis.  Patient has reached maximal medical improvement. After review of the [place]  guidelines for permanent partial impairment patient's impairment would be 10% of the left foot due to decreased subtalar motion.  Follow-Up Instructions: Return if symptoms worsen or fail to improve.   Ortho Exam  Patient is alert, oriented, no adenopathy, well-dressed, normal affect, normal respiratory effort. Examination patient has a normal gait. He has good plantarflexion and dorsiflexion of the ankle. He only has about 20 of inversion and eversion of the left foot.  Imaging: No results found.  Labs: Lab Results  Component Value Date   LABURIC 5.8 07/10/2014    Orders:  No orders of the defined types were placed in this encounter.  No orders of the defined types were placed in this encounter.    Procedures: No procedures performed  Clinical Data: No additional findings.  ROS:  All other systems negative, except as noted in the  HPI. Review of Systems  Objective: Vital Signs: Ht 6\' 2"  (1.88 m)   Wt 225 lb (102.1 kg)   BMI 28.89 kg/m   Specialty Comments:  No specialty comments available.  PMFS History: Patient Active Problem List   Diagnosis Date Noted  . Achilles rupture, left, subsequent encounter 04/12/2016  . History of peptic ulcer 07/10/2014  . History of atrial fibrillation 07/10/2014  . Panic disorder 08/28/2012  . GERD (gastroesophageal reflux disease) 11/12/2010  . HERNIA, UMBILICAL 12/12/2008   Past Medical History:  Diagnosis Date  . Dysrhythmia    Atrial Fibrillation  . GERD (gastroesophageal reflux disease)   . PONV (postoperative nausea and vomiting)     Family History  Problem Relation Age of Onset  . Prostate cancer Father     Past Surgical History:  Procedure Laterality Date  . ACHILLES TENDON SURGERY Left 02/26/2016   Procedure: ACHILLES TENDON REPAIR;  Surgeon: Nadara MustardMarcus V Jillaine Waren, MD;  Location: [facility] OR;  Service: Orthopedics;  Laterality: Left;  . DIAGNOSTIC LAPAROSCOPY     Social History   Occupational History  . Not on file.   Social History Main Topics  . Smoking status: Never Smoker  . Smokeless tobacco: Never Used  . Alcohol use 7.2 oz/week    12 Cans of beer per week  . Drug use: Unknown  . Sexual activity: Not on file

## 2016-11-19 ENCOUNTER — Other Ambulatory Visit: Payer: Self-pay | Admitting: Family Medicine

## 2016-11-19 NOTE — Telephone Encounter (Signed)
Is this okay to refill? 

## 2016-11-26 ENCOUNTER — Other Ambulatory Visit: Payer: Self-pay

## 2016-11-26 DIAGNOSIS — F410 Panic disorder [episodic paroxysmal anxiety] without agoraphobia: Secondary | ICD-10-CM

## 2016-11-26 MED ORDER — ALPRAZOLAM 0.5 MG PO TABS: ORAL_TABLET | ORAL | 0 refills | Status: DC

## 2016-11-26 MED ORDER — ZOLPIDEM TARTRATE 10 MG PO TABS: ORAL_TABLET | ORAL | 0 refills | Status: DC

## 2016-11-26 NOTE — Telephone Encounter (Signed)
Called in Ambien and Xanax per [facility] [facility] [employer]

## 2017-01-05 ENCOUNTER — Other Ambulatory Visit: Payer: Self-pay | Admitting: Family Medicine

## 2017-01-05 DIAGNOSIS — M109 Gout, unspecified: Secondary | ICD-10-CM

## 2017-01-31 MED ORDER — CELECOXIB 200 MG PO CAPS: 200.0000 mg | ORAL_CAPSULE | Freq: Two times a day (BID) | ORAL | 1 refills | Status: DC

## 2017-04-13 ENCOUNTER — Other Ambulatory Visit: Payer: Self-pay | Admitting: Family Medicine

## 2017-04-13 DIAGNOSIS — M109 Gout, unspecified: Secondary | ICD-10-CM

## 2017-06-09 ENCOUNTER — Other Ambulatory Visit: Payer: Self-pay | Admitting: Family Medicine

## 2017-06-10 NOTE — Telephone Encounter (Signed)
Can I pt have a refill on meds

## 2017-06-27 ENCOUNTER — Telehealth: Payer: Self-pay | Admitting: Family Medicine

## 2017-06-27 ENCOUNTER — Telehealth: Payer: Self-pay

## 2017-06-27 ENCOUNTER — Other Ambulatory Visit: Payer: Self-pay | Admitting: Family Medicine

## 2017-06-27 DIAGNOSIS — F410 Panic disorder [episodic paroxysmal anxiety] without agoraphobia: Secondary | ICD-10-CM

## 2017-06-27 MED ORDER — ALPRAZOLAM 0.5 MG PO TABS: ORAL_TABLET | ORAL | 0 refills | Status: DC

## 2017-06-27 NOTE — Telephone Encounter (Signed)
[facility] [place] [phone] . Called pt advised of same

## 2017-06-27 NOTE — Telephone Encounter (Signed)
Dr Susann GivensLalonde advised me to call in Xanax .5mg   #30    Zero refills.

## 2017-06-27 NOTE — Telephone Encounter (Signed)
Called pt back . Pt wanted to be schedule for an appointment. Did not leave a reason for the appointment. LVM for pt to call back . Thanks [employer]

## 2017-06-27 NOTE — Telephone Encounter (Signed)
Pt called earlier requesting an appt to see Dr Susann GivensLalonde to discuss Xanax. Pt says he need to start this med before Wednesday. But did not want to make an appt with another provider since Dr Susann GivensLalonde is out of the office this week

## 2017-06-28 ENCOUNTER — Telehealth: Payer: Self-pay

## 2017-06-28 NOTE — Telephone Encounter (Signed)
Returned pt Phone call to schedule his appoinment per his request. No answer left a message to call the office back . Thanks [employer]

## 2017-06-29 NOTE — Telephone Encounter (Signed)
ERROR

## 2017-07-04 ENCOUNTER — Encounter: Payer: Self-pay | Admitting: Family Medicine

## 2017-07-04 ENCOUNTER — Ambulatory Visit: Payer: BC Managed Care – PPO | Admitting: Family Medicine

## 2017-07-04 VITALS — BP 110/74 | HR 68 | Temp 98.1°F | Ht 74.0 in | Wt 235.4 lb

## 2017-07-04 DIAGNOSIS — J069 Acute upper respiratory infection, unspecified: Secondary | ICD-10-CM | POA: Diagnosis not present

## 2017-07-04 NOTE — Patient Instructions (Signed)
  Drink plenty of water. Use nasal saline frequently to keep the mucus membranes moist. Consider doing sinus rinses with sinus rinse kit or Neti-pot (use distilled or boiled water).   Take Mucinex (12 hour pill, plain, not D or DM) twice daily to help keep mucus and phlegm thin. You can consider trying Afrin nasal spray just at bedtime. Do not use this regularly for longer than 2-3 days or it can cause rebound swelling and be somewhat addicting. If/when your cough gets worse, you can use the Nyquil at night, or use Delsym syrup (this is a 12 hour dextromethorphan cough suppressant--it is found in Nyquil, so don't use both together at night.).  Call or return if you develop fever, worsening sinus pain, worsening symptoms in general. Viruses can take up to 7-14 days to resolve, usually feeling your worst in the first 3-5 days (now).

## 2017-07-04 NOTE — Progress Notes (Signed)
Chief Complaint  Patient presents with  . Nasal Congestion    started yesterday and has yellow discolored mucus. No body aches or fever. No coughing or any flu like symptoms.    Started with head and sinus congestion 2 nights ago. Feeling a little run down, but no fevers or body aches.  Nasal mucous is green-yellow.  No sick contacts.  No OTC meds tried, other than a dose of Nyquil last night. Can't breathe out of his nose.  PMH, PSH, SH reviewed  Outpatient Encounter Medications as of 07/04/2017  Medication Sig  . allopurinol (ZYLOPRIM) 300 MG tablet TAKE 1 TABLET BY MOUTH EVERY DAY  . ALPRAZolam (XANAX) 0.5 MG tablet Take one tablet by mouth twice daily  for anxiety  . celecoxib (CELEBREX) 200 MG capsule TAKE 1 CAPSULE (200 MG TOTAL) BY MOUTH 2 (TWO) TIMES DAILY.  . citalopram (CELEXA) 40 MG tablet TAKE ONE TABLET BY MOUTH ONE TIME DAILY  . flecainide (TAMBOCOR) 150 MG tablet Take 150 mg by mouth 2 (two) times daily.   Marland Kitchen LORazepam (ATIVAN) 0.5 MG tablet Take 1 tablet (0.5 mg total) by mouth 2 (two) times daily as needed. for anxiety  . magnesium oxide (MAG-OX) 400 MG tablet Take 400 mg by mouth every morning.  . pantoprazole (PROTONIX) 40 MG tablet Take 1 tablet (40 mg total) by mouth daily.  . colchicine 0.6 MG tablet Take 1 tablet (0.6 mg total) by mouth 2 (two) times daily. (Patient not taking: Reported on 07/04/2017)  . nitroGLYCERIN (NITRODUR - DOSED IN MG/24 HR) 0.2 mg/hr patch Place 1 patch (0.2 mg total) onto the skin daily. (Patient not taking: Reported on 07/04/2017)  . zolpidem (AMBIEN) 10 MG tablet Take 1 tablet by mouth at bed time as needed for sleep (Patient not taking: Reported on 07/04/2017)   No facility-administered encounter medications on file as of 07/04/2017.    No Known Allergies  ROS:  No fever, chills. Had a stomach bug 2 weeks ago, with myalgias. GI symptoms resolved. Denies nausea, vomiting, diarrhea, rashes.  Slight cough. No chest pain (very sharp, no  congestion or pressure).  Denies shortness of breath.  PHYSICAL EXAM:  BP 110/74   Pulse 68   Temp 98.1 F (36.7 C) (Tympanic)   Ht _0  (1.88 m)   Wt 235 lb 6.4 oz (106.8 kg)   BMI 30.22 kg/m   Well appearing, pleasant male in no distress HEENT: PERRL, EOMI, conjunctiva and sclera clear.  TM's and EAC's normal.  Nasal mucosa is moderately edematous, with thin, slightly yellow mucous noted bilaterally.  No erythema, sinuses nontender, OP is clear Neck: no lymphadenopathy or mass Heart: regular rate and rhythm, no murmur Lungs: clear bilaterally Neuro: alert and oriented, cranial nerves intact, normal gait Psych: normal mood, affect, hygiene and grooming Skin:  Normal turgor, no visible rash  ASSESSMENT/PLAN:  Viral upper respiratory infection  Supportive measures reviewed, along with s/sx bacterial infection.   Drink plenty of water. Use nasal saline frequently to keep the mucus membranes moist. Consider doing sinus rinses with sinus rinse kit or Neti-pot (use distilled or boiled water).   Take Mucinex (12 hour pill, plain, not D or DM) twice daily to help keep mucus and phlegm thin. You can consider trying Afrin nasal spray just at bedtime. Do not use this regularly for longer than 2-3 days or it can cause rebound swelling and be somewhat addicting. If/when your cough gets worse, you can use the Nyquil at night, or use  Delsym syrup (this is a 12 hour dextromethorphan cough suppressant--it is found in Nyquil, so don't use both together at night.).  Call or return if you develop fever, worsening sinus pain, worsening symptoms in general. Viruses can take up to 7-14 days to resolve, usually feeling your worst in the first 3-5 days (now).

## 2017-07-11 ENCOUNTER — Other Ambulatory Visit: Payer: Self-pay | Admitting: Family Medicine

## 2017-07-11 MED ORDER — AZITHROMYCIN 500 MG PO TABS: 500.0000 mg | ORAL_TABLET | Freq: Every day | ORAL | 0 refills | Status: DC

## 2017-07-11 NOTE — Progress Notes (Signed)
He continues to have difficulty with cough and congestion and would like an antibiotic called in.  I will give him azithromycin as he has not made adequate progress.

## 2017-08-14 ENCOUNTER — Other Ambulatory Visit: Payer: Self-pay | Admitting: Family Medicine

## 2017-08-14 DIAGNOSIS — M109 Gout, unspecified: Secondary | ICD-10-CM

## 2017-08-15 NOTE — Telephone Encounter (Signed)
Please advise if this is ok to fill. thnaks kh

## 2017-09-06 ENCOUNTER — Other Ambulatory Visit: Payer: Self-pay | Admitting: Family Medicine

## 2017-09-06 NOTE — Telephone Encounter (Signed)
[facility] [place] [facility]. Please advise . KH

## 2017-10-05 ENCOUNTER — Other Ambulatory Visit: Payer: Self-pay | Admitting: Family Medicine

## 2018-02-23 ENCOUNTER — Other Ambulatory Visit: Payer: Self-pay | Admitting: Family Medicine

## 2018-02-23 DIAGNOSIS — F410 Panic disorder [episodic paroxysmal anxiety] without agoraphobia: Secondary | ICD-10-CM

## 2018-02-23 MED ORDER — ALPRAZOLAM 0.5 MG PO TABS: ORAL_TABLET | ORAL | 0 refills | Status: DC

## 2018-05-01 ENCOUNTER — Other Ambulatory Visit: Payer: Self-pay | Admitting: Family Medicine

## 2018-05-01 DIAGNOSIS — M109 Gout, unspecified: Secondary | ICD-10-CM

## 2018-06-21 ENCOUNTER — Ambulatory Visit: Payer: Self-pay | Admitting: Family Medicine

## 2018-06-21 MED ORDER — OSELTAMIVIR PHOSPHATE 75 MG PO CAPS: 75.0000 mg | ORAL_CAPSULE | Freq: Every day | ORAL | 0 refills | Status: DC

## 2018-06-21 MED ORDER — ALPRAZOLAM 0.5 MG PO TABS: ORAL_TABLET | ORAL | 0 refills | Status: DC

## 2018-06-21 MED ORDER — ZOLPIDEM TARTRATE 10 MG PO TABS: ORAL_TABLET | ORAL | 0 refills | Status: DC

## 2018-06-21 NOTE — Progress Notes (Signed)
Patient not seen.  Tamiflu called in due to flu exposure.  He also got a refill on his Xanax and Ambien.

## 2018-08-11 ENCOUNTER — Other Ambulatory Visit: Payer: Self-pay | Admitting: Family Medicine

## 2018-08-11 NOTE — Telephone Encounter (Signed)
[facility] Please advise [facility] 

## 2018-09-24 ENCOUNTER — Other Ambulatory Visit: Payer: Self-pay | Admitting: Family Medicine

## 2018-09-25 NOTE — Telephone Encounter (Signed)
[facility] for 90 days. Please advise [facility]

## 2018-10-27 ENCOUNTER — Other Ambulatory Visit: Payer: Self-pay | Admitting: Family Medicine

## 2018-10-27 NOTE — Telephone Encounter (Signed)
Is this ok to refill?  

## 2018-11-13 ENCOUNTER — Other Ambulatory Visit: Payer: Self-pay | Admitting: Family Medicine

## 2018-11-13 DIAGNOSIS — M109 Gout, unspecified: Secondary | ICD-10-CM

## 2019-01-11 ENCOUNTER — Other Ambulatory Visit: Payer: Self-pay

## 2019-01-11 ENCOUNTER — Ambulatory Visit: Payer: BLUE CROSS/BLUE SHIELD | Admitting: Family Medicine

## 2019-01-11 ENCOUNTER — Encounter: Payer: Self-pay | Admitting: Family Medicine

## 2019-01-11 VITALS — BP 118/74 | HR 55 | Temp 98.5°F | Wt 230.4 lb

## 2019-01-11 DIAGNOSIS — K219 Gastro-esophageal reflux disease without esophagitis: Secondary | ICD-10-CM | POA: Diagnosis not present

## 2019-01-11 DIAGNOSIS — I480 Paroxysmal atrial fibrillation: Secondary | ICD-10-CM

## 2019-01-11 DIAGNOSIS — I493 Ventricular premature depolarization: Secondary | ICD-10-CM

## 2019-01-11 DIAGNOSIS — F410 Panic disorder [episodic paroxysmal anxiety] without agoraphobia: Secondary | ICD-10-CM

## 2019-01-11 DIAGNOSIS — Z79899 Other long term (current) drug therapy: Secondary | ICD-10-CM

## 2019-01-11 DIAGNOSIS — M1A9XX0 Chronic gout, unspecified, without tophus (tophi): Secondary | ICD-10-CM

## 2019-01-11 MED ORDER — CITALOPRAM HYDROBROMIDE 40 MG PO TABS: 40.0000 mg | ORAL_TABLET | Freq: Every day | ORAL | 3 refills | Status: DC

## 2019-01-11 MED ORDER — ALLOPURINOL 300 MG PO TABS: 300.0000 mg | ORAL_TABLET | Freq: Every day | ORAL | 3 refills | Status: DC

## 2019-01-11 MED ORDER — ALPRAZOLAM 0.5 MG PO TABS: ORAL_TABLET | ORAL | 1 refills | Status: DC

## 2019-01-11 NOTE — Progress Notes (Signed)
   Subjective:    Patient ID: Ronald Clark, male    DOB: Jan 19, 1976, 43 y.o.   MRN: [id number]  HPI He is here for medication check appointment.  He is now working in [employer].  He comes back here quite often.  He and his wife are now getting along much better.  He plans to eventually move back to this area.  His reflux is under good control.  He does have gout and continues on allopurinol.  He occasionally needs help with sleep and with anxiety.  Does have a history of paroxysmal atrial fib as well as PVCs and does follow-up with cardiology in [place].  Otherwise life is going fairly well for him.   Review of Systems     Objective:   Physical Exam Alert and in no distress. Tympanic membranes and canals are normal. Pharyngeal area is normal. Neck is supple without adenopathy or thyromegaly. Cardiac exam shows a regular sinus rhythm without murmurs or gallops. Lungs are clear to auscultation.        Assessment & Plan:  Encounter for long-term (current) use of medications - Plan: CBC with Differential/Platelet, Comprehensive metabolic panel, Lipid panel,  Panic disorder - Plan: ALPRAZolam (XANAX) 0.5 MG tablet,  Chronic gout without tophus, unspecified cause, unspecified site - Plan: allopurinol (ZYLOPRIM) 300 MG tablet,   Gastroesophageal reflux disease without esophagitis - Plan: Treat as needed  PAF (paroxysmal atrial fibrillation) (HCC) - Plan: Follow-up with cardiology as needed  Symptomatic PVCs - Plan: Follow-up with cardiology as needed

## 2019-01-12 LAB — COMPREHENSIVE METABOLIC PANEL
ALT: 20 IU/L (ref 0–44)
AST: 16 IU/L (ref 0–40)
Albumin/Globulin Ratio: 2.1 (ref 1.2–2.2)
Albumin: 4.8 g/dL (ref 4.0–5.0)
Alkaline Phosphatase: 68 IU/L (ref 39–117)
BUN/Creatinine Ratio: 12 (ref 9–20)
BUN: 15 mg/dL (ref 6–24)
Bilirubin Total: 0.5 mg/dL (ref 0.0–1.2)
CO2: 25 mmol/L (ref 20–29)
Calcium: 10.3 mg/dL — ABNORMAL HIGH (ref 8.7–10.2)
Chloride: 99 mmol/L (ref 96–106)
Creatinine, Ser: 1.25 mg/dL (ref 0.76–1.27)
GFR calc Af Amer: 80 mL/min/{1.73_m2} (ref >59)
GFR calc non Af Amer: 69 mL/min/{1.73_m2} (ref >59)
Globulin, Total: 2.3 g/dL (ref 1.5–4.5)
Glucose: 105 mg/dL — ABNORMAL HIGH (ref 65–99)
Potassium: 4.7 mmol/L (ref 3.5–5.2)
Sodium: 139 mmol/L (ref 134–144)
Total Protein: 7.1 g/dL (ref 6.0–8.5)

## 2019-01-12 LAB — CBC WITH DIFFERENTIAL/PLATELET
Basophils Absolute: 0 10*3/uL (ref 0.0–0.2)
Basos: 1 %
EOS (ABSOLUTE): 0.2 10*3/uL (ref 0.0–0.4)
Eos: 3 %
Hematocrit: 45.1 % (ref 37.5–51.0)
Hemoglobin: 15.7 g/dL (ref 13.0–17.7)
Immature Grans (Abs): 0 10*3/uL (ref 0.0–0.1)
Immature Granulocytes: 1 %
Lymphocytes Absolute: 1.1 10*3/uL (ref 0.7–3.1)
Lymphs: 21 %
MCH: 30.8 pg (ref 26.6–33.0)
MCHC: 34.8 g/dL (ref 31.5–35.7)
MCV: 88 fL (ref 79–97)
Monocytes Absolute: 0.4 10*3/uL (ref 0.1–0.9)
Monocytes: 7 %
Neutrophils Absolute: 3.6 10*3/uL (ref 1.4–7.0)
Neutrophils: 67 %
Platelets: 153 10*3/uL (ref 150–450)
RBC: 5.1 x10E6/uL (ref 4.14–5.80)
RDW: 12.5 % (ref 11.6–15.4)
WBC: 5.3 10*3/uL (ref 3.4–10.8)

## 2019-01-12 LAB — LIPID PANEL
Chol/HDL Ratio: 4.9 ratio (ref 0.0–5.0)
Cholesterol, Total: 204 mg/dL — ABNORMAL HIGH (ref 100–199)
HDL: 42 mg/dL (ref >39)
LDL Calculated: 130 mg/dL — ABNORMAL HIGH (ref 0–99)
Triglycerides: 162 mg/dL — ABNORMAL HIGH (ref 0–149)
VLDL Cholesterol Cal: 32 mg/dL (ref 5–40)

## 2019-01-13 LAB — URIC ACID: Uric Acid: 5.4 mg/dL (ref 3.7–8.6)

## 2019-01-13 LAB — SPECIMEN STATUS REPORT

## 2019-01-19 ENCOUNTER — Other Ambulatory Visit: Payer: Self-pay | Admitting: Family Medicine

## 2019-01-19 DIAGNOSIS — F410 Panic disorder [episodic paroxysmal anxiety] without agoraphobia: Secondary | ICD-10-CM

## 2019-01-19 MED ORDER — ALPRAZOLAM 0.5 MG PO TABS: ORAL_TABLET | ORAL | 1 refills | Status: DC

## 2019-04-05 ENCOUNTER — Other Ambulatory Visit: Payer: Self-pay | Admitting: Family Medicine

## 2019-04-05 DIAGNOSIS — F410 Panic disorder [episodic paroxysmal anxiety] without agoraphobia: Secondary | ICD-10-CM

## 2019-04-05 MED ORDER — ALPRAZOLAM 0.5 MG PO TABS: ORAL_TABLET | ORAL | 1 refills | Status: DC

## 2019-04-05 NOTE — Progress Notes (Signed)
He called for a refill on his Xanax.  His wife has filed for divorce.  He does have a Chief Executive Officer.  Encouraged him to listen to his lawyer and explained the fact it is going to be several years before this is resolved.

## 2019-04-24 ENCOUNTER — Other Ambulatory Visit: Payer: Self-pay | Admitting: Family Medicine

## 2019-05-24 ENCOUNTER — Ambulatory Visit: Payer: BLUE CROSS/BLUE SHIELD | Admitting: Family Medicine

## 2019-05-24 ENCOUNTER — Encounter: Payer: Self-pay | Admitting: Family Medicine

## 2019-05-24 ENCOUNTER — Other Ambulatory Visit: Payer: Self-pay

## 2019-05-24 VITALS — BP 120/76 | HR 65 | Temp 97.7°F | Wt 224.0 lb

## 2019-05-24 DIAGNOSIS — F410 Panic disorder [episodic paroxysmal anxiety] without agoraphobia: Secondary | ICD-10-CM

## 2019-05-24 DIAGNOSIS — I480 Paroxysmal atrial fibrillation: Secondary | ICD-10-CM

## 2019-05-24 DIAGNOSIS — Z8042 Family history of malignant neoplasm of prostate: Secondary | ICD-10-CM | POA: Diagnosis not present

## 2019-05-24 DIAGNOSIS — R7309 Other abnormal glucose: Secondary | ICD-10-CM

## 2019-05-24 LAB — POCT GLYCOSYLATED HEMOGLOBIN (HGB A1C): Hemoglobin A1C: 5.1 % (ref 4.0–5.6)

## 2019-05-24 MED ORDER — ALPRAZOLAM 0.5 MG PO TABS: ORAL_TABLET | ORAL | 1 refills | Status: DC

## 2019-05-24 NOTE — Progress Notes (Signed)
   Subjective:    Patient ID: Ronald Clark, male    DOB: May 17, 1976, 43 y.o.   MRN: [id number]  HPI He is here for consultation.  He is dealing with an impending divorce and seems to be handling this fairly well.  He would like a refill on his Xanax.  He is interested in pursuing further counseling but wants to do that at a later date.  He also is looking to get a new job and did have some recent blood work done it did show some minor abnormalities.  He does have a history of PAF and does occasionally see cardiology.  He also indicates that his father had prostate cancer.  He did have a blood sugar that was elevated.   Review of Systems     Objective:   Physical Exam Alert and in no distress.  Appropriate demeanor. Hemoglobin A1c is 5.1      Assessment & Plan:  Panic disorder - Plan: ALPRAZolam (XANAX) 0.5 MG tablet  PAF (paroxysmal atrial fibrillation) (HCC)  Elevated glucose - Plan: HgB A1c  Family history of prostate cancer in father Discussed the impending divorce and the fact that he seems to be handling this fairly well.  I will give him Xanax.  Did recommend that he write things down before he goes to bed to try and get them out of his hands to help with sleep.  He will continue to follow-up with cardiology.  Explained that we will start checking for prostate cancer closer to age 89.  No history of diabetes.

## 2019-07-30 ENCOUNTER — Other Ambulatory Visit: Payer: Self-pay | Admitting: Family Medicine

## 2019-07-30 MED ORDER — SILDENAFIL CITRATE 100 MG PO TABS: 100.0000 mg | ORAL_TABLET | Freq: Every day | ORAL | 2 refills | Status: DC | PRN

## 2019-07-30 NOTE — Progress Notes (Signed)
He is now divorced and starting to date again and having some erectile dysfunction issues.  Sildenafil was called in with instructions on proper use and possible side effects.

## 2019-10-08 ENCOUNTER — Other Ambulatory Visit: Payer: Self-pay

## 2019-10-08 ENCOUNTER — Ambulatory Visit
Admission: RE | Admit: 2019-10-08 | Discharge: 2019-10-08 | Disposition: A | Discharge status: Home / Self Care | Payer: No Typology Code available for payment source | Source: Ambulatory Visit | Attending: Nurse Practitioner | Admitting: Nurse Practitioner

## 2019-10-08 ENCOUNTER — Other Ambulatory Visit: Payer: Self-pay | Admitting: Nurse Practitioner

## 2019-10-08 DIAGNOSIS — Z021 Encounter for pre-employment examination: Secondary | ICD-10-CM

## 2019-10-09 ENCOUNTER — Encounter: Payer: Self-pay | Admitting: Family Medicine

## 2019-10-09 ENCOUNTER — Other Ambulatory Visit: Payer: Self-pay

## 2019-10-09 ENCOUNTER — Ambulatory Visit: Payer: BLUE CROSS/BLUE SHIELD | Admitting: Family Medicine

## 2019-10-09 VITALS — BP 120/82 | HR 66 | Temp 97.7°F | Wt 229.0 lb

## 2019-10-09 DIAGNOSIS — F410 Panic disorder [episodic paroxysmal anxiety] without agoraphobia: Secondary | ICD-10-CM

## 2019-10-09 DIAGNOSIS — S39011A Strain of muscle, fascia and tendon of abdomen, initial encounter: Secondary | ICD-10-CM | POA: Diagnosis not present

## 2019-10-09 MED ORDER — CITALOPRAM HYDROBROMIDE 20 MG PO TABS: 20.0000 mg | ORAL_TABLET | Freq: Every day | ORAL | 0 refills | Status: DC

## 2019-10-09 NOTE — Progress Notes (Signed)
   Subjective:    Patient ID: Ronald Clark, male    DOB: 09/11/1975, 44 y.o.   MRN: [id number]  HPI He is here for consult concerning lower abdominal discomfort going into the inguinal area.  He is concerned previous umbilical hernia surgery as well as inguinal hernia surgery.  Apparently mesh was used in both. He is in the process of applying for a job on [employer].  He has had difficulty with [facility] rejected him for psychological reasons.  He thinks it is related to the Escitalopram.  He was placed on this several years ago when he was dealing with job-related stress, anxiety with flying, marital difficulty.  Since then he has done quite nicely and is interested in stopping the medication.   Review of Systems     Objective:   Physical Exam Alert and in no distress.  Abdominal exam shows no palpable lesions in the periumbilical area as well as in the inguinal and genital area.      Assessment & Plan:  Panic disorder - Plan: citalopram (CELEXA) 20 MG tablet  Strain of abdominal muscle, initial encounter I explained the abdominal symptoms are probably abdominal muscle strain and did recommend backing off on his abdominal exercises but get himself in better physical shape for applying for the [employer]. I then discussed the Escitalopram.  He is interested in stopping this medication which I agree.  I will taper him to 20 mg for 2 weeks and then stop.  He will keep me informed as to how he is doing.  At this point I do not think that that is a major issue.

## 2019-10-23 ENCOUNTER — Other Ambulatory Visit: Payer: Self-pay | Admitting: Family Medicine

## 2019-10-30 ENCOUNTER — Other Ambulatory Visit: Payer: Self-pay | Admitting: Family Medicine

## 2019-10-30 DIAGNOSIS — F410 Panic disorder [episodic paroxysmal anxiety] without agoraphobia: Secondary | ICD-10-CM

## 2019-10-30 NOTE — Telephone Encounter (Signed)
[facility] Please advise. KH 

## 2020-01-14 ENCOUNTER — Other Ambulatory Visit: Payer: Self-pay | Admitting: Family Medicine

## 2020-01-14 ENCOUNTER — Telehealth: Payer: Self-pay | Admitting: Family Medicine

## 2020-01-14 DIAGNOSIS — M1A9XX0 Chronic gout, unspecified, without tophus (tophi): Secondary | ICD-10-CM

## 2020-01-14 DIAGNOSIS — F410 Panic disorder [episodic paroxysmal anxiety] without agoraphobia: Secondary | ICD-10-CM

## 2020-01-14 MED ORDER — CITALOPRAM HYDROBROMIDE 20 MG PO TABS: 20.0000 mg | ORAL_TABLET | Freq: Every day | ORAL | 0 refills | Status: DC

## 2020-01-14 NOTE — Telephone Encounter (Signed)
Appt was [facility]

## 2020-01-14 NOTE — Telephone Encounter (Signed)
I called it in but have him set up a virtual visit in about a month

## 2020-01-14 NOTE — Telephone Encounter (Signed)
[facility] Please advise. KH 

## 2020-01-14 NOTE — Telephone Encounter (Signed)
Pt would like to go back on the Citalopram, he has been going thru divorce and now moving back to [place] and starting new job.  And the medication really helped in the past.  [facility]

## 2020-01-16 ENCOUNTER — Telehealth: Payer: BLUE CROSS/BLUE SHIELD | Admitting: Family Medicine

## 2020-01-16 ENCOUNTER — Encounter: Payer: Self-pay | Admitting: Family Medicine

## 2020-01-16 ENCOUNTER — Other Ambulatory Visit: Payer: Self-pay

## 2020-01-16 VITALS — Ht 75.0 in | Wt 220.0 lb

## 2020-01-16 DIAGNOSIS — F322 Major depressive disorder, single episode, severe without psychotic features: Secondary | ICD-10-CM | POA: Diagnosis not present

## 2020-01-16 NOTE — Progress Notes (Signed)
   Subjective:    Patient ID: Ronald Clark, male    DOB: 02/21/1976, 44 y.o.   MRN: [id number]  HPI I connected with  Abhijot Straughter on 01/16/20 by a video enabled telemedicine application and verified that I am speaking with the correct person using two identifiers. I discussed the limitations of evaluation and management by telemedicine. The patient expressed understanding and agreed to proceed.  Caregility was used.  He is in his car and I am in my office. He is presently going through a divorce and there apparently is a lot of turmoil.  He is having difficulty with sleep, eating, interacting with his children and his estranged wife.  Apparently there is a boyfriend involved now as well.  He has had suicidal thoughts but has no plans.  He reached out to me to get back on medication.  Review of Systems     Objective:   Physical Exam Alert and in obvious mental stress.  PHQ score of 24.       Assessment & Plan:  Depression, major, single episode, severe (HCC) He is going to pick up his children for the day and spend time with him.  I have a verbal contract with him that he will not attempt harm to himself or others.  Tomorrow he is to go to the [employer].

## 2020-01-17 ENCOUNTER — Other Ambulatory Visit: Payer: Self-pay

## 2020-01-17 ENCOUNTER — Ambulatory Visit (HOSPITAL_COMMUNITY)
Admission: EM | Admit: 2020-01-17 | Discharge: 2020-01-17 | Disposition: A | Discharge status: Home / Self Care | Payer: BLUE CROSS/BLUE SHIELD

## 2020-01-17 NOTE — Care Management (Signed)
Patient denies SI/HI/Psychosis/Sustance Abuse.  Patient declines MSE Exam.  Patient reports that he would prefer to follow up with his PCP.

## 2020-02-05 ENCOUNTER — Other Ambulatory Visit: Payer: Self-pay | Admitting: Family Medicine

## 2020-02-05 DIAGNOSIS — F410 Panic disorder [episodic paroxysmal anxiety] without agoraphobia: Secondary | ICD-10-CM

## 2020-02-05 NOTE — Telephone Encounter (Signed)
Ok to refill 

## 2020-02-07 ENCOUNTER — Encounter: Payer: BLUE CROSS/BLUE SHIELD | Admitting: Family Medicine

## 2020-02-19 ENCOUNTER — Other Ambulatory Visit: Payer: Self-pay | Admitting: Family Medicine

## 2020-02-19 DIAGNOSIS — F4322 Adjustment disorder with anxiety: Secondary | ICD-10-CM | POA: Diagnosis not present

## 2020-02-19 DIAGNOSIS — F410 Panic disorder [episodic paroxysmal anxiety] without agoraphobia: Secondary | ICD-10-CM

## 2020-02-20 NOTE — Telephone Encounter (Signed)
[facility] for [facility] days. Please advise [facility]

## 2020-03-07 DIAGNOSIS — Z03818 Encounter for observation for suspected exposure to other biological agents ruled out: Secondary | ICD-10-CM | POA: Diagnosis not present

## 2020-03-31 DIAGNOSIS — A630 Anogenital (venereal) warts: Secondary | ICD-10-CM | POA: Diagnosis not present

## 2020-03-31 DIAGNOSIS — L821 Other seborrheic keratosis: Secondary | ICD-10-CM | POA: Diagnosis not present

## 2020-03-31 DIAGNOSIS — L570 Actinic keratosis: Secondary | ICD-10-CM | POA: Diagnosis not present

## 2020-03-31 DIAGNOSIS — L905 Scar conditions and fibrosis of skin: Secondary | ICD-10-CM | POA: Diagnosis not present

## 2020-04-04 DIAGNOSIS — Z03818 Encounter for observation for suspected exposure to other biological agents ruled out: Secondary | ICD-10-CM | POA: Diagnosis not present

## 2020-04-09 DIAGNOSIS — Z23 Encounter for immunization: Secondary | ICD-10-CM | POA: Diagnosis not present

## 2020-05-07 DIAGNOSIS — Z03818 Encounter for observation for suspected exposure to other biological agents ruled out: Secondary | ICD-10-CM | POA: Diagnosis not present

## 2020-05-12 DIAGNOSIS — M9902 Segmental and somatic dysfunction of thoracic region: Secondary | ICD-10-CM | POA: Diagnosis not present

## 2020-05-12 DIAGNOSIS — M791 Myalgia, unspecified site: Secondary | ICD-10-CM | POA: Diagnosis not present

## 2020-05-12 DIAGNOSIS — M9905 Segmental and somatic dysfunction of pelvic region: Secondary | ICD-10-CM | POA: Diagnosis not present

## 2020-05-12 DIAGNOSIS — M9903 Segmental and somatic dysfunction of lumbar region: Secondary | ICD-10-CM | POA: Diagnosis not present

## 2020-05-13 ENCOUNTER — Encounter: Payer: Self-pay | Admitting: Family Medicine

## 2020-05-13 ENCOUNTER — Other Ambulatory Visit: Payer: Self-pay

## 2020-05-13 ENCOUNTER — Ambulatory Visit: Payer: BC Managed Care – PPO | Admitting: Family Medicine

## 2020-05-13 VITALS — BP 118/72 | HR 66 | Temp 98.0°F | Ht 74.5 in | Wt 225.6 lb

## 2020-05-13 DIAGNOSIS — Z860100 Personal history of colon polyps, unspecified: Secondary | ICD-10-CM

## 2020-05-13 DIAGNOSIS — K22719 Barrett's esophagus with dysplasia, unspecified: Secondary | ICD-10-CM

## 2020-05-13 DIAGNOSIS — F410 Panic disorder [episodic paroxysmal anxiety] without agoraphobia: Secondary | ICD-10-CM

## 2020-05-13 DIAGNOSIS — K219 Gastro-esophageal reflux disease without esophagitis: Secondary | ICD-10-CM

## 2020-05-13 DIAGNOSIS — Z23 Encounter for immunization: Secondary | ICD-10-CM | POA: Diagnosis not present

## 2020-05-13 DIAGNOSIS — Z8711 Personal history of peptic ulcer disease: Secondary | ICD-10-CM

## 2020-05-13 DIAGNOSIS — Z0000 Encounter for general adult medical examination without abnormal findings: Secondary | ICD-10-CM

## 2020-05-13 DIAGNOSIS — Z125 Encounter for screening for malignant neoplasm of prostate: Secondary | ICD-10-CM

## 2020-05-13 DIAGNOSIS — Z8679 Personal history of other diseases of the circulatory system: Secondary | ICD-10-CM

## 2020-05-13 DIAGNOSIS — I480 Paroxysmal atrial fibrillation: Secondary | ICD-10-CM | POA: Diagnosis not present

## 2020-05-13 DIAGNOSIS — Z13220 Encounter for screening for lipoid disorders: Secondary | ICD-10-CM | POA: Diagnosis not present

## 2020-05-13 DIAGNOSIS — I493 Ventricular premature depolarization: Secondary | ICD-10-CM

## 2020-05-13 NOTE — Progress Notes (Signed)
   Subjective:    Patient ID: Ronald Clark, male    DOB: 12/23/1975, 44 y.o.   MRN: [id number]  HPI He is here for complete examination.  He is now working as a Water quality scientist at [employer].  He had a good season.  He did injure his neck in August and has been having some slight difficulty with that.  Presently he is getting chiropractic help with that.  Reinforced the need to continue with that.  Psychologically he is doing well on Celexa and rarely uses Xanax.  He also is doing well concerning doing with his wife and children.  He does have a history of reflux disease with Barrett's esophagus, history of peptic ulcer.  He also apparently has a history of colonic polyp.  He does plan to get follow-up with that with the gastroenterologist in [facility] concerning that.  He continues on allopurinol for treatment of gout.  He rarely uses sildenafil.  He does have a history of PAF and recognizes that stress, lack of sleep and alcohol can precipitate that and he is making efforts to keep that under good control.  He has had very few episodes of A. fib that lasted for several seconds.  Family and social history as well as health maintenance and immunizations was reviewed.  He would like to be screened for prostate cancer.  Review of Systems  All other systems reviewed and are negative.      Objective:   Physical Exam Alert and in no distress. Tympanic membranes and canals are normal. Pharyngeal area is normal. Neck is supple without adenopathy or thyromegaly. Cardiac exam shows a regular sinus rhythm without murmurs or gallops. Lungs are clear to auscultation.       Assessment & Plan:  Routine general medical examination at a health care facility - Plan: CBC with Differential/Platelet, Comprehensive metabolic panel, Lipid panel  Gastroesophageal reflux disease without esophagitis  History of peptic ulcer  Panic disorder  PAF (paroxysmal atrial fibrillation) (HCC)  Symptomatic  PVCs  History of atrial fibrillation  Need for COVID-19 vaccine - Plan: Pfizer SARS-COV-2 Vaccine  Need for Tdap vaccination - Plan: Tdap vaccine greater than or equal to 7yo IM  Barrett's esophagus with dysplasia  History of colonic polyps  Screening for prostate cancer - Plan: PSA  He will follow up with gastroenterology on the need for colonoscopy as well as endoscopy for the above diagnoses.  Discussed PSA testing but will order one at his request.  Immunizations were updated.  At this time no follow-up needed for the atrial fib as he has a good way on how to handle it. Continue on Celexa for the time being.

## 2020-05-14 LAB — CBC WITH DIFFERENTIAL/PLATELET
Basophils Absolute: 0.1 10*3/uL (ref 0.0–0.2)
Basos: 1 %
EOS (ABSOLUTE): 0.1 10*3/uL (ref 0.0–0.4)
Eos: 1 %
Hematocrit: 44.3 % (ref 37.5–51.0)
Hemoglobin: 15.1 g/dL (ref 13.0–17.7)
Immature Grans (Abs): 0 10*3/uL (ref 0.0–0.1)
Immature Granulocytes: 0 %
Lymphocytes Absolute: 0.9 10*3/uL (ref 0.7–3.1)
Lymphs: 13 %
MCH: 31.2 pg (ref 26.6–33.0)
MCHC: 34.1 g/dL (ref 31.5–35.7)
MCV: 92 fL (ref 79–97)
Monocytes Absolute: 0.7 10*3/uL (ref 0.1–0.9)
Monocytes: 10 %
Neutrophils Absolute: 5.3 10*3/uL (ref 1.4–7.0)
Neutrophils: 75 %
Platelets: 192 10*3/uL (ref 150–450)
RBC: 4.84 x10E6/uL (ref 4.14–5.80)
RDW: 12.4 % (ref 11.6–15.4)
WBC: 7 10*3/uL (ref 3.4–10.8)

## 2020-05-14 LAB — COMPREHENSIVE METABOLIC PANEL
ALT: 24 IU/L (ref 0–44)
AST: 22 IU/L (ref 0–40)
Albumin/Globulin Ratio: 1.7 (ref 1.2–2.2)
Albumin: 4.6 g/dL (ref 4.0–5.0)
Alkaline Phosphatase: 64 IU/L (ref 44–121)
BUN/Creatinine Ratio: 19 (ref 9–20)
BUN: 25 mg/dL — ABNORMAL HIGH (ref 6–24)
Bilirubin Total: 0.5 mg/dL (ref 0.0–1.2)
CO2: 26 mmol/L (ref 20–29)
Calcium: 10.1 mg/dL (ref 8.7–10.2)
Chloride: 98 mmol/L (ref 96–106)
Creatinine, Ser: 1.34 mg/dL — ABNORMAL HIGH (ref 0.76–1.27)
GFR calc Af Amer: 73 mL/min/{1.73_m2} (ref >59)
GFR calc non Af Amer: 63 mL/min/{1.73_m2} (ref >59)
Globulin, Total: 2.7 g/dL (ref 1.5–4.5)
Glucose: 93 mg/dL (ref 65–99)
Potassium: 4.6 mmol/L (ref 3.5–5.2)
Sodium: 138 mmol/L (ref 134–144)
Total Protein: 7.3 g/dL (ref 6.0–8.5)

## 2020-05-14 LAB — LIPID PANEL
Chol/HDL Ratio: 4.8 ratio (ref 0.0–5.0)
Cholesterol, Total: 189 mg/dL (ref 100–199)
HDL: 39 mg/dL — ABNORMAL LOW (ref >39)
LDL Chol Calc (NIH): 109 mg/dL — ABNORMAL HIGH (ref 0–99)
Triglycerides: 235 mg/dL — ABNORMAL HIGH (ref 0–149)
VLDL Cholesterol Cal: 41 mg/dL — ABNORMAL HIGH (ref 5–40)

## 2020-05-15 DIAGNOSIS — L570 Actinic keratosis: Secondary | ICD-10-CM | POA: Diagnosis not present

## 2020-05-15 DIAGNOSIS — L918 Other hypertrophic disorders of the skin: Secondary | ICD-10-CM | POA: Diagnosis not present

## 2020-05-15 DIAGNOSIS — A630 Anogenital (venereal) warts: Secondary | ICD-10-CM | POA: Diagnosis not present

## 2020-05-15 LAB — PSA: Prostate Specific Ag, Serum: 0.4 ng/mL (ref 0.0–4.0)

## 2020-05-15 LAB — SPECIMEN STATUS REPORT

## 2020-05-20 DIAGNOSIS — M9903 Segmental and somatic dysfunction of lumbar region: Secondary | ICD-10-CM | POA: Diagnosis not present

## 2020-05-20 DIAGNOSIS — M9902 Segmental and somatic dysfunction of thoracic region: Secondary | ICD-10-CM | POA: Diagnosis not present

## 2020-05-20 DIAGNOSIS — M9905 Segmental and somatic dysfunction of pelvic region: Secondary | ICD-10-CM | POA: Diagnosis not present

## 2020-05-20 DIAGNOSIS — M791 Myalgia, unspecified site: Secondary | ICD-10-CM | POA: Diagnosis not present

## 2020-05-26 ENCOUNTER — Telehealth: Payer: BC Managed Care – PPO | Admitting: Family Medicine

## 2020-05-26 ENCOUNTER — Encounter: Payer: Self-pay | Admitting: Family Medicine

## 2020-05-26 VITALS — Ht 75.0 in | Wt 225.0 lb

## 2020-05-26 DIAGNOSIS — J209 Acute bronchitis, unspecified: Secondary | ICD-10-CM | POA: Diagnosis not present

## 2020-05-26 DIAGNOSIS — J019 Acute sinusitis, unspecified: Secondary | ICD-10-CM | POA: Diagnosis not present

## 2020-05-26 MED ORDER — AZITHROMYCIN 500 MG PO TABS: 500.0000 mg | ORAL_TABLET | Freq: Every day | ORAL | 0 refills | Status: DC

## 2020-05-26 NOTE — Progress Notes (Signed)
   Subjective:    Patient ID: Ronald Clark, male    DOB: Jul 24, 1975, 44 y.o.   MRN: [id number]  HPI I connected with  Ronald Clark on 05/26/20 by a video enabled telemedicine application and verified that I am speaking with the correct person using two identifiers.  Caregility use.  Me: Office patient: Home I discussed the limitations of evaluation and management by telemedicine. The patient expressed understanding and agreed to proceed. He complains of a 9-day history of sore throat, PND, fatigue and now productive cough with nasal congestion sinus pressure.  He does not smoke.  He has had no earache, shortness of breath, smell or taste change.  He has been around his children all weekend   Review of Systems     Objective:   Physical Exam Alert and in no distress with fatigue look on his face.       Assessment & Plan:  Acute sinusitis, recurrence not specified, unspecified location - Plan: azithromycin (ZITHROMAX) 500 MG tablet  Acute bronchitis, unspecified organism - Plan: azithromycin (ZITHROMAX) 500 MG tablet Think his symptoms are consistent with sinusitis bronchitis.  I will give him a Z-Pak which she has had in the past.  He will call me in 1 week if not totally back to normal.  He was comfortable with that.  21 minutes spent today reviewing his pertinent past medical history evaluation management and documentation.

## 2020-06-04 DIAGNOSIS — M9903 Segmental and somatic dysfunction of lumbar region: Secondary | ICD-10-CM | POA: Diagnosis not present

## 2020-06-04 DIAGNOSIS — M9902 Segmental and somatic dysfunction of thoracic region: Secondary | ICD-10-CM | POA: Diagnosis not present

## 2020-06-04 DIAGNOSIS — M791 Myalgia, unspecified site: Secondary | ICD-10-CM | POA: Diagnosis not present

## 2020-06-04 DIAGNOSIS — M9905 Segmental and somatic dysfunction of pelvic region: Secondary | ICD-10-CM | POA: Diagnosis not present

## 2020-06-06 ENCOUNTER — Other Ambulatory Visit: Payer: Self-pay | Admitting: Family Medicine

## 2020-06-06 MED ORDER — AMOXICILLIN 875 MG PO TABS: 875.0000 mg | ORAL_TABLET | Freq: Two times a day (BID) | ORAL | 0 refills | Status: DC

## 2020-06-06 NOTE — Progress Notes (Signed)
Amoxil for sinusutis

## 2020-06-09 DIAGNOSIS — Z03818 Encounter for observation for suspected exposure to other biological agents ruled out: Secondary | ICD-10-CM | POA: Diagnosis not present

## 2020-06-11 DIAGNOSIS — M791 Myalgia, unspecified site: Secondary | ICD-10-CM | POA: Diagnosis not present

## 2020-06-11 DIAGNOSIS — M9902 Segmental and somatic dysfunction of thoracic region: Secondary | ICD-10-CM | POA: Diagnosis not present

## 2020-06-11 DIAGNOSIS — M9903 Segmental and somatic dysfunction of lumbar region: Secondary | ICD-10-CM | POA: Diagnosis not present

## 2020-06-11 DIAGNOSIS — M9905 Segmental and somatic dysfunction of pelvic region: Secondary | ICD-10-CM | POA: Diagnosis not present

## 2020-06-16 DIAGNOSIS — Z20822 Contact with and (suspected) exposure to covid-19: Secondary | ICD-10-CM | POA: Diagnosis not present

## 2020-06-16 DIAGNOSIS — Z03818 Encounter for observation for suspected exposure to other biological agents ruled out: Secondary | ICD-10-CM | POA: Diagnosis not present

## 2020-06-18 DIAGNOSIS — M791 Myalgia, unspecified site: Secondary | ICD-10-CM | POA: Diagnosis not present

## 2020-06-18 DIAGNOSIS — M9902 Segmental and somatic dysfunction of thoracic region: Secondary | ICD-10-CM | POA: Diagnosis not present

## 2020-06-18 DIAGNOSIS — M9905 Segmental and somatic dysfunction of pelvic region: Secondary | ICD-10-CM | POA: Diagnosis not present

## 2020-06-18 DIAGNOSIS — M9903 Segmental and somatic dysfunction of lumbar region: Secondary | ICD-10-CM | POA: Diagnosis not present

## 2020-06-19 DIAGNOSIS — Z8679 Personal history of other diseases of the circulatory system: Secondary | ICD-10-CM | POA: Diagnosis not present

## 2020-06-19 DIAGNOSIS — K219 Gastro-esophageal reflux disease without esophagitis: Secondary | ICD-10-CM | POA: Diagnosis not present

## 2020-06-19 DIAGNOSIS — K2270 Barrett's esophagus without dysplasia: Secondary | ICD-10-CM | POA: Diagnosis not present

## 2020-06-19 DIAGNOSIS — Z1211 Encounter for screening for malignant neoplasm of colon: Secondary | ICD-10-CM | POA: Diagnosis not present

## 2020-07-11 DIAGNOSIS — I4891 Unspecified atrial fibrillation: Secondary | ICD-10-CM | POA: Diagnosis not present

## 2020-07-11 DIAGNOSIS — Z1211 Encounter for screening for malignant neoplasm of colon: Secondary | ICD-10-CM | POA: Diagnosis not present

## 2020-07-11 DIAGNOSIS — K635 Polyp of colon: Secondary | ICD-10-CM | POA: Diagnosis not present

## 2020-07-11 DIAGNOSIS — K219 Gastro-esophageal reflux disease without esophagitis: Secondary | ICD-10-CM | POA: Diagnosis not present

## 2020-07-11 DIAGNOSIS — K2950 Unspecified chronic gastritis without bleeding: Secondary | ICD-10-CM | POA: Diagnosis not present

## 2020-07-11 DIAGNOSIS — K317 Polyp of stomach and duodenum: Secondary | ICD-10-CM | POA: Diagnosis not present

## 2020-07-11 LAB — HM COLONOSCOPY

## 2020-07-15 ENCOUNTER — Other Ambulatory Visit: Payer: Self-pay | Admitting: Family Medicine

## 2020-07-15 DIAGNOSIS — M1A9XX0 Chronic gout, unspecified, without tophus (tophi): Secondary | ICD-10-CM

## 2020-07-17 DIAGNOSIS — L570 Actinic keratosis: Secondary | ICD-10-CM | POA: Diagnosis not present

## 2020-07-17 DIAGNOSIS — A630 Anogenital (venereal) warts: Secondary | ICD-10-CM | POA: Diagnosis not present

## 2020-07-17 DIAGNOSIS — L918 Other hypertrophic disorders of the skin: Secondary | ICD-10-CM | POA: Diagnosis not present

## 2020-08-15 ENCOUNTER — Other Ambulatory Visit: Payer: Self-pay | Admitting: Family Medicine

## 2020-08-15 NOTE — Telephone Encounter (Signed)
[facility] Please advise. KH 

## 2020-08-19 DIAGNOSIS — Z03818 Encounter for observation for suspected exposure to other biological agents ruled out: Secondary | ICD-10-CM | POA: Diagnosis not present

## 2020-08-19 DIAGNOSIS — Z20822 Contact with and (suspected) exposure to covid-19: Secondary | ICD-10-CM | POA: Diagnosis not present

## 2020-09-10 DIAGNOSIS — A630 Anogenital (venereal) warts: Secondary | ICD-10-CM | POA: Diagnosis not present

## 2020-09-15 DIAGNOSIS — Z03818 Encounter for observation for suspected exposure to other biological agents ruled out: Secondary | ICD-10-CM | POA: Diagnosis not present

## 2020-09-20 ENCOUNTER — Other Ambulatory Visit: Payer: Self-pay | Admitting: Family Medicine

## 2020-10-31 ENCOUNTER — Other Ambulatory Visit: Payer: Self-pay | Admitting: Family Medicine

## 2020-10-31 DIAGNOSIS — F410 Panic disorder [episodic paroxysmal anxiety] without agoraphobia: Secondary | ICD-10-CM

## 2020-10-31 DIAGNOSIS — A630 Anogenital (venereal) warts: Secondary | ICD-10-CM | POA: Diagnosis not present

## 2020-10-31 DIAGNOSIS — D235 Other benign neoplasm of skin of trunk: Secondary | ICD-10-CM | POA: Diagnosis not present

## 2020-10-31 MED ORDER — ALPRAZOLAM 0.5 MG PO TABS
ORAL_TABLET | ORAL | 1 refills | Status: DC
Start: 2020-10-31 — End: 2021-04-27

## 2020-10-31 MED ORDER — ZOLPIDEM TARTRATE 10 MG PO TABS: ORAL_TABLET | ORAL | 0 refills | Status: DC

## 2020-10-31 NOTE — Progress Notes (Signed)
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

## 2020-11-11 ENCOUNTER — Other Ambulatory Visit: Payer: Self-pay | Admitting: Family Medicine

## 2020-11-11 NOTE — Telephone Encounter (Signed)
[facility] Please advise. KH 

## 2020-11-19 DIAGNOSIS — M9902 Segmental and somatic dysfunction of thoracic region: Secondary | ICD-10-CM | POA: Diagnosis not present

## 2020-11-19 DIAGNOSIS — M9903 Segmental and somatic dysfunction of lumbar region: Secondary | ICD-10-CM | POA: Diagnosis not present

## 2020-11-19 DIAGNOSIS — M62838 Other muscle spasm: Secondary | ICD-10-CM | POA: Diagnosis not present

## 2020-11-19 DIAGNOSIS — M9905 Segmental and somatic dysfunction of pelvic region: Secondary | ICD-10-CM | POA: Diagnosis not present

## 2020-11-21 DIAGNOSIS — K2270 Barrett's esophagus without dysplasia: Secondary | ICD-10-CM | POA: Diagnosis not present

## 2020-11-21 DIAGNOSIS — F410 Panic disorder [episodic paroxysmal anxiety] without agoraphobia: Secondary | ICD-10-CM | POA: Diagnosis not present

## 2020-11-21 DIAGNOSIS — R351 Nocturia: Secondary | ICD-10-CM | POA: Diagnosis not present

## 2020-11-21 DIAGNOSIS — I493 Ventricular premature depolarization: Secondary | ICD-10-CM | POA: Diagnosis not present

## 2020-11-21 DIAGNOSIS — I480 Paroxysmal atrial fibrillation: Secondary | ICD-10-CM | POA: Diagnosis not present

## 2020-12-05 DIAGNOSIS — M9903 Segmental and somatic dysfunction of lumbar region: Secondary | ICD-10-CM | POA: Diagnosis not present

## 2020-12-05 DIAGNOSIS — M62838 Other muscle spasm: Secondary | ICD-10-CM | POA: Diagnosis not present

## 2020-12-05 DIAGNOSIS — M9905 Segmental and somatic dysfunction of pelvic region: Secondary | ICD-10-CM | POA: Diagnosis not present

## 2020-12-05 DIAGNOSIS — M9902 Segmental and somatic dysfunction of thoracic region: Secondary | ICD-10-CM | POA: Diagnosis not present

## 2020-12-21 ENCOUNTER — Other Ambulatory Visit: Payer: Self-pay | Admitting: Family Medicine

## 2020-12-24 DIAGNOSIS — L570 Actinic keratosis: Secondary | ICD-10-CM | POA: Diagnosis not present

## 2020-12-24 DIAGNOSIS — A630 Anogenital (venereal) warts: Secondary | ICD-10-CM | POA: Diagnosis not present

## 2020-12-27 ENCOUNTER — Other Ambulatory Visit: Payer: Self-pay | Admitting: Family Medicine

## 2020-12-27 DIAGNOSIS — M1A9XX0 Chronic gout, unspecified, without tophus (tophi): Secondary | ICD-10-CM

## 2021-01-13 DIAGNOSIS — M9905 Segmental and somatic dysfunction of pelvic region: Secondary | ICD-10-CM | POA: Diagnosis not present

## 2021-01-13 DIAGNOSIS — M62838 Other muscle spasm: Secondary | ICD-10-CM | POA: Diagnosis not present

## 2021-01-13 DIAGNOSIS — M9902 Segmental and somatic dysfunction of thoracic region: Secondary | ICD-10-CM | POA: Diagnosis not present

## 2021-01-13 DIAGNOSIS — M9903 Segmental and somatic dysfunction of lumbar region: Secondary | ICD-10-CM | POA: Diagnosis not present

## 2021-01-23 DIAGNOSIS — M62838 Other muscle spasm: Secondary | ICD-10-CM | POA: Diagnosis not present

## 2021-01-23 DIAGNOSIS — M9903 Segmental and somatic dysfunction of lumbar region: Secondary | ICD-10-CM | POA: Diagnosis not present

## 2021-01-23 DIAGNOSIS — M9905 Segmental and somatic dysfunction of pelvic region: Secondary | ICD-10-CM | POA: Diagnosis not present

## 2021-01-23 DIAGNOSIS — M9902 Segmental and somatic dysfunction of thoracic region: Secondary | ICD-10-CM | POA: Diagnosis not present

## 2021-03-20 ENCOUNTER — Other Ambulatory Visit: Payer: Self-pay | Admitting: Family Medicine

## 2021-04-07 ENCOUNTER — Other Ambulatory Visit: Payer: Self-pay | Admitting: Family Medicine

## 2021-04-07 DIAGNOSIS — M1A9XX0 Chronic gout, unspecified, without tophus (tophi): Secondary | ICD-10-CM

## 2021-04-16 ENCOUNTER — Encounter: Payer: Self-pay | Admitting: Family Medicine

## 2021-04-16 ENCOUNTER — Telehealth: Payer: Self-pay

## 2021-04-16 ENCOUNTER — Other Ambulatory Visit: Payer: Self-pay

## 2021-04-16 ENCOUNTER — Telehealth (INDEPENDENT_AMBULATORY_CARE_PROVIDER_SITE_OTHER): Payer: BC Managed Care – PPO | Admitting: Family Medicine

## 2021-04-16 VITALS — Temp 97.0°F | Wt 225.0 lb

## 2021-04-16 DIAGNOSIS — J111 Influenza due to unidentified influenza virus with other respiratory manifestations: Secondary | ICD-10-CM

## 2021-04-16 NOTE — Telephone Encounter (Signed)
LM for pt to call back to cover co pay or log in my chart to pay it. Kh

## 2021-04-16 NOTE — Progress Notes (Signed)
   Subjective:    Patient ID: Ronald Clark, male    DOB: 04/26/1976, 45 y.o.   MRN: [id number]  HPI Documentation for virtual audio and video telecommunications through Caregility encounter: The patient was located at home. 2 patient identifiers used.  The provider was located in the office. The patient did consent to this visit and is aware of possible charges through their insurance for this visit. The other persons participating in this telemedicine service were none. Time spent on call was 5 minutes and in review of previous records >15 minutes total for counseling and coordination of care. This virtual service is not related to other E/M service within previous 7 days.  Complains of a 4-day history of started with sore throat, nasal congestion, coughing.  No fever, chills, earache.  No smell or taste change.  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 Review of Systems     Objective:   Physical Exam Alert and toxic appearing with normal breathing pattern.       Assessment & Plan:  Flu syndrome I explained that I thought the symptoms were more consistent with the flu especially since he was exposed to it from his team.  Recommend 2 Tylenol 4 times per day as well as NSAID of choice if that is needed.  Robitussin-DM during the day and NyQuil at night.  He was comfortable with that.

## 2021-04-27 ENCOUNTER — Other Ambulatory Visit: Payer: Self-pay

## 2021-04-27 ENCOUNTER — Encounter: Payer: Self-pay | Admitting: Family Medicine

## 2021-04-27 ENCOUNTER — Telehealth: Payer: BC Managed Care – PPO | Admitting: Family Medicine

## 2021-04-27 VITALS — BP 118/70 | Wt 225.0 lb

## 2021-04-27 DIAGNOSIS — J209 Acute bronchitis, unspecified: Secondary | ICD-10-CM

## 2021-04-27 DIAGNOSIS — F40243 Fear of flying: Secondary | ICD-10-CM

## 2021-04-27 MED ORDER — ALPRAZOLAM 0.5 MG PO TABS: ORAL_TABLET | ORAL | 0 refills | Status: DC

## 2021-04-27 MED ORDER — AMOXICILLIN 875 MG PO TABS: 875.0000 mg | ORAL_TABLET | Freq: Two times a day (BID) | ORAL | 0 refills | Status: DC

## 2021-04-27 NOTE — Progress Notes (Signed)
   Subjective:    Patient ID: Ronald Clark, male    DOB: 03/25/1976, 45 y.o.   MRN: [id number]  HPI Documentation for virtual audio and video telecommunications through Caregility encounter:  The patient was located at home. 2 patient identifiers used.  The provider was located in the office. The patient did consent to this visit and is aware of possible charges through their insurance for this visit. The other persons participating in this telemedicine service were none. Time spent on call was 4 minutes and in review of previous records >17 minutes total for counseling and coordination of care. This virtual service is not related to other E/M service within previous 7 days.  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  He continues have difficulty with chest congestion and productive cough but no fever, chills, sore throat or earache.  He also is planning a trip back to [place] and would like some Xanax to help with the flight.  He has had previous difficulties with  Review of Systems     Objective:   Physical Exam Alert and in no distress .  He does not appear toxic.       Assessment & Plan:  Fear of flying - Plan: ALPRAZolam (XANAX) 0.5 MG tablet  Acute bronchitis, unspecified organism - Plan: amoxicillin (AMOXIL) 875 MG tablet

## 2021-05-22 ENCOUNTER — Other Ambulatory Visit: Payer: Self-pay | Admitting: Family Medicine

## 2021-05-22 NOTE — Telephone Encounter (Signed)
[facility]. Please advise KH 

## 2021-06-17 ENCOUNTER — Other Ambulatory Visit: Payer: Self-pay | Admitting: Family Medicine

## 2021-07-01 ENCOUNTER — Encounter: Payer: Self-pay | Admitting: Family Medicine

## 2021-07-15 ENCOUNTER — Other Ambulatory Visit: Payer: Self-pay | Admitting: Family Medicine

## 2021-07-15 DIAGNOSIS — M1A9XX0 Chronic gout, unspecified, without tophus (tophi): Secondary | ICD-10-CM

## 2021-07-24 DIAGNOSIS — I4891 Unspecified atrial fibrillation: Secondary | ICD-10-CM | POA: Diagnosis not present

## 2021-07-24 DIAGNOSIS — R002 Palpitations: Secondary | ICD-10-CM | POA: Diagnosis not present

## 2021-07-24 DIAGNOSIS — F410 Panic disorder [episodic paroxysmal anxiety] without agoraphobia: Secondary | ICD-10-CM | POA: Diagnosis not present

## 2021-07-24 DIAGNOSIS — I480 Paroxysmal atrial fibrillation: Secondary | ICD-10-CM | POA: Diagnosis not present

## 2021-07-24 DIAGNOSIS — I493 Ventricular premature depolarization: Secondary | ICD-10-CM | POA: Diagnosis not present

## 2021-07-24 DIAGNOSIS — I499 Cardiac arrhythmia, unspecified: Secondary | ICD-10-CM | POA: Diagnosis not present

## 2021-08-06 DIAGNOSIS — M9902 Segmental and somatic dysfunction of thoracic region: Secondary | ICD-10-CM | POA: Diagnosis not present

## 2021-08-06 DIAGNOSIS — M9903 Segmental and somatic dysfunction of lumbar region: Secondary | ICD-10-CM | POA: Diagnosis not present

## 2021-08-06 DIAGNOSIS — M9905 Segmental and somatic dysfunction of pelvic region: Secondary | ICD-10-CM | POA: Diagnosis not present

## 2021-08-06 DIAGNOSIS — M62838 Other muscle spasm: Secondary | ICD-10-CM | POA: Diagnosis not present

## 2021-08-20 DIAGNOSIS — M9902 Segmental and somatic dysfunction of thoracic region: Secondary | ICD-10-CM | POA: Diagnosis not present

## 2021-08-20 DIAGNOSIS — M9905 Segmental and somatic dysfunction of pelvic region: Secondary | ICD-10-CM | POA: Diagnosis not present

## 2021-08-20 DIAGNOSIS — M9903 Segmental and somatic dysfunction of lumbar region: Secondary | ICD-10-CM | POA: Diagnosis not present

## 2021-08-20 DIAGNOSIS — M5451 Vertebrogenic low back pain: Secondary | ICD-10-CM | POA: Diagnosis not present

## 2021-09-03 DIAGNOSIS — M9902 Segmental and somatic dysfunction of thoracic region: Secondary | ICD-10-CM | POA: Diagnosis not present

## 2021-09-03 DIAGNOSIS — M9905 Segmental and somatic dysfunction of pelvic region: Secondary | ICD-10-CM | POA: Diagnosis not present

## 2021-09-03 DIAGNOSIS — M9907 Segmental and somatic dysfunction of upper extremity: Secondary | ICD-10-CM | POA: Diagnosis not present

## 2021-09-03 DIAGNOSIS — M5451 Vertebrogenic low back pain: Secondary | ICD-10-CM | POA: Diagnosis not present

## 2021-09-03 DIAGNOSIS — M9903 Segmental and somatic dysfunction of lumbar region: Secondary | ICD-10-CM | POA: Diagnosis not present

## 2021-09-15 DIAGNOSIS — M9903 Segmental and somatic dysfunction of lumbar region: Secondary | ICD-10-CM | POA: Diagnosis not present

## 2021-09-15 DIAGNOSIS — M9907 Segmental and somatic dysfunction of upper extremity: Secondary | ICD-10-CM | POA: Diagnosis not present

## 2021-09-15 DIAGNOSIS — M5451 Vertebrogenic low back pain: Secondary | ICD-10-CM | POA: Diagnosis not present

## 2021-09-15 DIAGNOSIS — M9905 Segmental and somatic dysfunction of pelvic region: Secondary | ICD-10-CM | POA: Diagnosis not present

## 2021-09-15 DIAGNOSIS — M9902 Segmental and somatic dysfunction of thoracic region: Secondary | ICD-10-CM | POA: Diagnosis not present

## 2021-09-18 ENCOUNTER — Ambulatory Visit (INDEPENDENT_AMBULATORY_CARE_PROVIDER_SITE_OTHER): Payer: BC Managed Care – PPO | Admitting: Family Medicine

## 2021-09-18 ENCOUNTER — Encounter: Payer: Self-pay | Admitting: Family Medicine

## 2021-09-18 ENCOUNTER — Other Ambulatory Visit: Payer: Self-pay | Admitting: Family Medicine

## 2021-09-18 VITALS — BP 106/68 | HR 53 | Temp 96.8°F | Ht 74.0 in | Wt 237.2 lb

## 2021-09-18 DIAGNOSIS — I480 Paroxysmal atrial fibrillation: Secondary | ICD-10-CM | POA: Diagnosis not present

## 2021-09-18 DIAGNOSIS — F40243 Fear of flying: Secondary | ICD-10-CM | POA: Diagnosis not present

## 2021-09-18 DIAGNOSIS — Z0000 Encounter for general adult medical examination without abnormal findings: Secondary | ICD-10-CM | POA: Diagnosis not present

## 2021-09-18 DIAGNOSIS — N529 Male erectile dysfunction, unspecified: Secondary | ICD-10-CM

## 2021-09-18 DIAGNOSIS — Z1159 Encounter for screening for other viral diseases: Secondary | ICD-10-CM | POA: Diagnosis not present

## 2021-09-18 DIAGNOSIS — K219 Gastro-esophageal reflux disease without esophagitis: Secondary | ICD-10-CM

## 2021-09-18 DIAGNOSIS — Z860100 Personal history of colon polyps, unspecified: Secondary | ICD-10-CM

## 2021-09-18 DIAGNOSIS — M1A9XX0 Chronic gout, unspecified, without tophus (tophi): Secondary | ICD-10-CM

## 2021-09-18 DIAGNOSIS — Z136 Encounter for screening for cardiovascular disorders: Secondary | ICD-10-CM | POA: Diagnosis not present

## 2021-09-18 DIAGNOSIS — M1A0790 Idiopathic chronic gout, unspecified ankle and foot, without tophus (tophi): Secondary | ICD-10-CM

## 2021-09-18 DIAGNOSIS — K22719 Barrett's esophagus with dysplasia, unspecified: Secondary | ICD-10-CM

## 2021-09-18 DIAGNOSIS — F410 Panic disorder [episodic paroxysmal anxiety] without agoraphobia: Secondary | ICD-10-CM

## 2021-09-18 MED ORDER — ALLOPURINOL 300 MG PO TABS: 300.0000 mg | ORAL_TABLET | Freq: Every day | ORAL | 3 refills | Status: DC

## 2021-09-18 MED ORDER — CITALOPRAM HYDROBROMIDE 40 MG PO TABS: 40.0000 mg | ORAL_TABLET | Freq: Every day | ORAL | 3 refills | Status: DC

## 2021-09-18 MED ORDER — PANTOPRAZOLE SODIUM 40 MG PO TBEC: 40.0000 mg | DELAYED_RELEASE_TABLET | Freq: Every day | ORAL | 3 refills | Status: DC

## 2021-09-18 MED ORDER — SILDENAFIL CITRATE 100 MG PO TABS: 100.0000 mg | ORAL_TABLET | Freq: Every day | ORAL | 2 refills | Status: DC | PRN

## 2021-09-18 NOTE — Progress Notes (Signed)
? ?  Subjective:  ? ? Patient ID: Ronald Clark, male    DOB: [birth year], 46 y.o.   MRN: [id number] ? ?HPI ?He is here for complete examination.  He continues to be followed by cardiology for his underlying PAF.  He is presently on flecainide.  He also has a history of Barrett's esophagus and continues on Protonix for that.  He is also taking allopurinol regularly and has had no difficulty with gout outbreaks.  He does have underlying ED and would like to continue on his sildenafil.  Celexa is working quite nicely for him psychologically.  He rarely uses Xanax.  He does complain of continued difficulty with low back pain.  He has been seen by chiropractic in the past which does give some minimal relief of his symptoms.  No numbness, tingling or weakness.  His work is going fairly well.  He is divorced and does have the children however apparently there have been some issues with the children not necessarily wanting to spend time with him.  Otherwise his family and social history as well as health maintenance and immunizations was reviewed. ? ? ?Review of Systems  ?All other systems reviewed and are negative. ? ?   ?Objective:  ? Physical Exam ?Alert and in no distress. Tympanic membranes and canals are normal. Pharyngeal area is normal. Neck is supple without adenopathy or thyromegaly. Cardiac exam shows a regular sinus rhythm without murmurs or gallops. Lungs are clear to auscultation. ?Back exam shows good motion.  Normal hip motion.  Negative straight leg raising and normal DTRs. ? ? ? ?   ?Assessment & Plan:  ?Routine general medical examination at a health care facility - Plan: CBC with Differential/Platelet, Comprehensive metabolic panel, Lipid panel ? ?Fear of flying ? ?PAF (paroxysmal atrial fibrillation) (HCC) - Plan: CBC with Differential/Platelet, Comprehensive metabolic panel, Lipid panel ? ?Gastroesophageal reflux disease without esophagitis - Plan: pantoprazole (PROTONIX) 40 MG tablet ? 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 ?Chronic gout without tophus, unspecified cause, unspecified site - Plan: allopurinol (ZYLOPRIM) 300 MG tablet, Uric Acid ? ?Need for hepatitis C screening test - Plan: Hepatitis C antibody ? ?Panic disorder - Plan: citalopram (CELEXA) 40 MG tablet ? ?Erectile dysfunction, unspecified erectile dysfunction type - Plan: sildenafil (VIAGRA) 100 MG tablet ?Continue on present medication regimen.  I will do x-rays to look at his back but informed him that probably the best way to handle this is a good physical therapy program.  He will continue be followed by cardiology.  Also discussed the possibility of talking to the children's pediatrician to relay his concerns over the children not wanting to spend any time with him to see if there is any underlying issues there. ? ?

## 2021-09-18 NOTE — Telephone Encounter (Signed)
[facility] to fill pt [facility]. Please advise KH °

## 2021-09-19 LAB — CBC WITH DIFFERENTIAL/PLATELET
Basophils Absolute: 0.1 10*3/uL (ref 0.0–0.2)
Basos: 1 %
EOS (ABSOLUTE): 0.1 10*3/uL (ref 0.0–0.4)
Eos: 2 %
Hematocrit: 47.8 % (ref 37.5–51.0)
Hemoglobin: 15.8 g/dL (ref 13.0–17.7)
Immature Grans (Abs): 0 10*3/uL (ref 0.0–0.1)
Immature Granulocytes: 0 %
Lymphocytes Absolute: 1.4 10*3/uL (ref 0.7–3.1)
Lymphs: 25 %
MCH: 29.8 pg (ref 26.6–33.0)
MCHC: 33.1 g/dL (ref 31.5–35.7)
MCV: 90 fL (ref 79–97)
Monocytes Absolute: 0.5 10*3/uL (ref 0.1–0.9)
Monocytes: 9 %
Neutrophils Absolute: 3.6 10*3/uL (ref 1.4–7.0)
Neutrophils: 63 %
Platelets: 185 10*3/uL (ref 150–450)
RBC: 5.31 x10E6/uL (ref 4.14–5.80)
RDW: 12.3 % (ref 11.6–15.4)
WBC: 5.8 10*3/uL (ref 3.4–10.8)

## 2021-09-19 LAB — COMPREHENSIVE METABOLIC PANEL
ALT: 24 IU/L (ref 0–44)
AST: 23 IU/L (ref 0–40)
Albumin/Globulin Ratio: 2.3 — ABNORMAL HIGH (ref 1.2–2.2)
Albumin: 5 g/dL (ref 4.0–5.0)
Alkaline Phosphatase: 72 IU/L (ref 44–121)
BUN/Creatinine Ratio: 19 (ref 9–20)
BUN: 26 mg/dL — ABNORMAL HIGH (ref 6–24)
Bilirubin Total: 0.7 mg/dL (ref 0.0–1.2)
CO2: 26 mmol/L (ref 20–29)
Calcium: 10.2 mg/dL (ref 8.7–10.2)
Chloride: 100 mmol/L (ref 96–106)
Creatinine, Ser: 1.35 mg/dL — ABNORMAL HIGH (ref 0.76–1.27)
Globulin, Total: 2.2 g/dL (ref 1.5–4.5)
Glucose: 101 mg/dL — ABNORMAL HIGH (ref 70–99)
Potassium: 4.5 mmol/L (ref 3.5–5.2)
Sodium: 140 mmol/L (ref 134–144)
Total Protein: 7.2 g/dL (ref 6.0–8.5)
eGFR: 65 mL/min/{1.73_m2} (ref >59)

## 2021-09-19 LAB — LIPID PANEL
Chol/HDL Ratio: 4.9 ratio (ref 0.0–5.0)
Cholesterol, Total: 209 mg/dL — ABNORMAL HIGH (ref 100–199)
HDL: 43 mg/dL (ref >39)
LDL Chol Calc (NIH): 133 mg/dL — ABNORMAL HIGH (ref 0–99)
Triglycerides: 183 mg/dL — ABNORMAL HIGH (ref 0–149)
VLDL Cholesterol Cal: 33 mg/dL (ref 5–40)

## 2021-09-19 LAB — HEPATITIS C ANTIBODY: Hep C Virus Ab: NONREACTIVE

## 2021-09-19 LAB — URIC ACID: Uric Acid: 6.9 mg/dL (ref 3.8–8.4)

## 2021-09-23 DIAGNOSIS — A630 Anogenital (venereal) warts: Secondary | ICD-10-CM | POA: Diagnosis not present

## 2021-09-23 DIAGNOSIS — L570 Actinic keratosis: Secondary | ICD-10-CM | POA: Diagnosis not present

## 2021-09-23 DIAGNOSIS — S61210A Laceration without foreign body of right index finger without damage to nail, initial encounter: Secondary | ICD-10-CM | POA: Diagnosis not present

## 2021-09-29 DIAGNOSIS — M9902 Segmental and somatic dysfunction of thoracic region: Secondary | ICD-10-CM | POA: Diagnosis not present

## 2021-09-29 DIAGNOSIS — M9905 Segmental and somatic dysfunction of pelvic region: Secondary | ICD-10-CM | POA: Diagnosis not present

## 2021-09-29 DIAGNOSIS — M9903 Segmental and somatic dysfunction of lumbar region: Secondary | ICD-10-CM | POA: Diagnosis not present

## 2021-09-29 DIAGNOSIS — M9907 Segmental and somatic dysfunction of upper extremity: Secondary | ICD-10-CM | POA: Diagnosis not present

## 2021-09-29 DIAGNOSIS — M5451 Vertebrogenic low back pain: Secondary | ICD-10-CM | POA: Diagnosis not present

## 2021-10-30 DIAGNOSIS — Z85828 Personal history of other malignant neoplasm of skin: Secondary | ICD-10-CM | POA: Diagnosis not present

## 2021-10-30 DIAGNOSIS — L570 Actinic keratosis: Secondary | ICD-10-CM | POA: Diagnosis not present

## 2021-10-30 DIAGNOSIS — A630 Anogenital (venereal) warts: Secondary | ICD-10-CM | POA: Diagnosis not present

## 2021-12-01 DIAGNOSIS — M9905 Segmental and somatic dysfunction of pelvic region: Secondary | ICD-10-CM | POA: Diagnosis not present

## 2021-12-01 DIAGNOSIS — M9907 Segmental and somatic dysfunction of upper extremity: Secondary | ICD-10-CM | POA: Diagnosis not present

## 2021-12-01 DIAGNOSIS — M9902 Segmental and somatic dysfunction of thoracic region: Secondary | ICD-10-CM | POA: Diagnosis not present

## 2021-12-01 DIAGNOSIS — M5451 Vertebrogenic low back pain: Secondary | ICD-10-CM | POA: Diagnosis not present

## 2021-12-01 DIAGNOSIS — M9903 Segmental and somatic dysfunction of lumbar region: Secondary | ICD-10-CM | POA: Diagnosis not present

## 2022-01-19 DIAGNOSIS — L918 Other hypertrophic disorders of the skin: Secondary | ICD-10-CM | POA: Diagnosis not present

## 2022-01-19 DIAGNOSIS — R21 Rash and other nonspecific skin eruption: Secondary | ICD-10-CM | POA: Diagnosis not present

## 2022-01-19 DIAGNOSIS — A630 Anogenital (venereal) warts: Secondary | ICD-10-CM | POA: Diagnosis not present

## 2022-01-19 DIAGNOSIS — L570 Actinic keratosis: Secondary | ICD-10-CM | POA: Diagnosis not present

## 2022-02-05 DIAGNOSIS — M5451 Vertebrogenic low back pain: Secondary | ICD-10-CM | POA: Diagnosis not present

## 2022-02-05 DIAGNOSIS — M9907 Segmental and somatic dysfunction of upper extremity: Secondary | ICD-10-CM | POA: Diagnosis not present

## 2022-02-05 DIAGNOSIS — M9905 Segmental and somatic dysfunction of pelvic region: Secondary | ICD-10-CM | POA: Diagnosis not present

## 2022-02-05 DIAGNOSIS — M9902 Segmental and somatic dysfunction of thoracic region: Secondary | ICD-10-CM | POA: Diagnosis not present

## 2022-02-05 DIAGNOSIS — M9903 Segmental and somatic dysfunction of lumbar region: Secondary | ICD-10-CM | POA: Diagnosis not present

## 2022-02-10 ENCOUNTER — Encounter: Payer: Self-pay | Admitting: Internal Medicine

## 2022-02-11 DIAGNOSIS — M6200 Separation of muscle (nontraumatic), unspecified site: Secondary | ICD-10-CM | POA: Diagnosis not present

## 2022-02-11 DIAGNOSIS — M9903 Segmental and somatic dysfunction of lumbar region: Secondary | ICD-10-CM | POA: Diagnosis not present

## 2022-02-11 DIAGNOSIS — M5451 Vertebrogenic low back pain: Secondary | ICD-10-CM | POA: Diagnosis not present

## 2022-02-11 DIAGNOSIS — R1030 Lower abdominal pain, unspecified: Secondary | ICD-10-CM | POA: Diagnosis not present

## 2022-02-11 DIAGNOSIS — M6281 Muscle weakness (generalized): Secondary | ICD-10-CM | POA: Diagnosis not present

## 2022-02-11 DIAGNOSIS — M9905 Segmental and somatic dysfunction of pelvic region: Secondary | ICD-10-CM | POA: Diagnosis not present

## 2022-02-11 DIAGNOSIS — M9902 Segmental and somatic dysfunction of thoracic region: Secondary | ICD-10-CM | POA: Diagnosis not present

## 2022-02-11 DIAGNOSIS — M9907 Segmental and somatic dysfunction of upper extremity: Secondary | ICD-10-CM | POA: Diagnosis not present

## 2022-02-11 DIAGNOSIS — R102 Pelvic and perineal pain: Secondary | ICD-10-CM | POA: Diagnosis not present

## 2022-02-22 ENCOUNTER — Ambulatory Visit: Payer: BC Managed Care – PPO | Admitting: Family Medicine

## 2022-02-22 ENCOUNTER — Encounter: Payer: Self-pay | Admitting: Family Medicine

## 2022-02-22 VITALS — BP 110/70 | HR 71 | Temp 97.7°F | Wt 234.6 lb

## 2022-02-22 DIAGNOSIS — M545 Low back pain, unspecified: Secondary | ICD-10-CM

## 2022-02-22 MED ORDER — HYDROCODONE-ACETAMINOPHEN 5-325 MG PO TABS: 1.0000 | ORAL_TABLET | Freq: Four times a day (QID) | ORAL | 0 refills | Status: AC | PRN

## 2022-02-22 NOTE — Progress Notes (Signed)
   Subjective:    Patient ID: Ronald Clark, male    DOB: 01-10-1976, 46 y.o.   MRN: [id number]  HPI He states that several days ago when he bent forward he developed some right-sided low back pain.  He has a long history of difficulty with back pain and sees a chiropractor regularly.  He has also had some dry needling done on this in the past.  Has no numbness tingling or weakness down his legs.   Review of Systems     Objective:   Physical Exam Alert and in no distress.  No palpable tenderness is noted.  There is pain on flexion as well as some with rotation.  Negative straight leg raising.  Normal hip motion.  Normal DTRs.       Assessment & Plan:  Acute right-sided low back pain without sciatica - Plan: HYDROcodone-acetaminophen (NORCO) 5-325 MG tablet Recommend he see his chiropractor again but would also recommend heat, stretching, Tylenol and ibuprofen and use Norco at night.

## 2022-03-02 ENCOUNTER — Other Ambulatory Visit: Payer: Self-pay | Admitting: Family Medicine

## 2022-03-02 DIAGNOSIS — F40243 Fear of flying: Secondary | ICD-10-CM

## 2022-03-02 MED ORDER — ALPRAZOLAM 0.5 MG PO TABS: ORAL_TABLET | ORAL | 0 refills | Status: DC

## 2022-03-02 NOTE — Progress Notes (Signed)
Xanax called in to help with some recent stress

## 2022-03-04 DIAGNOSIS — M5451 Vertebrogenic low back pain: Secondary | ICD-10-CM | POA: Diagnosis not present

## 2022-03-04 DIAGNOSIS — R1030 Lower abdominal pain, unspecified: Secondary | ICD-10-CM | POA: Diagnosis not present

## 2022-03-04 DIAGNOSIS — M9902 Segmental and somatic dysfunction of thoracic region: Secondary | ICD-10-CM | POA: Diagnosis not present

## 2022-03-04 DIAGNOSIS — R102 Pelvic and perineal pain: Secondary | ICD-10-CM | POA: Diagnosis not present

## 2022-03-04 DIAGNOSIS — M9907 Segmental and somatic dysfunction of upper extremity: Secondary | ICD-10-CM | POA: Diagnosis not present

## 2022-03-04 DIAGNOSIS — M6281 Muscle weakness (generalized): Secondary | ICD-10-CM | POA: Diagnosis not present

## 2022-03-04 DIAGNOSIS — M9905 Segmental and somatic dysfunction of pelvic region: Secondary | ICD-10-CM | POA: Diagnosis not present

## 2022-03-04 DIAGNOSIS — M9903 Segmental and somatic dysfunction of lumbar region: Secondary | ICD-10-CM | POA: Diagnosis not present

## 2022-03-04 DIAGNOSIS — M6200 Separation of muscle (nontraumatic), unspecified site: Secondary | ICD-10-CM | POA: Diagnosis not present

## 2022-03-09 DIAGNOSIS — L821 Other seborrheic keratosis: Secondary | ICD-10-CM | POA: Diagnosis not present

## 2022-03-09 DIAGNOSIS — L918 Other hypertrophic disorders of the skin: Secondary | ICD-10-CM | POA: Diagnosis not present

## 2022-03-09 DIAGNOSIS — L570 Actinic keratosis: Secondary | ICD-10-CM | POA: Diagnosis not present

## 2022-03-16 ENCOUNTER — Encounter: Payer: Self-pay | Admitting: Internal Medicine

## 2022-03-29 ENCOUNTER — Encounter: Payer: Self-pay | Admitting: Internal Medicine

## 2022-04-12 DIAGNOSIS — M9903 Segmental and somatic dysfunction of lumbar region: Secondary | ICD-10-CM | POA: Diagnosis not present

## 2022-04-12 DIAGNOSIS — M9902 Segmental and somatic dysfunction of thoracic region: Secondary | ICD-10-CM | POA: Diagnosis not present

## 2022-04-12 DIAGNOSIS — M9905 Segmental and somatic dysfunction of pelvic region: Secondary | ICD-10-CM | POA: Diagnosis not present

## 2022-04-12 DIAGNOSIS — M5451 Vertebrogenic low back pain: Secondary | ICD-10-CM | POA: Diagnosis not present

## 2022-04-12 DIAGNOSIS — M9907 Segmental and somatic dysfunction of upper extremity: Secondary | ICD-10-CM | POA: Diagnosis not present

## 2022-04-20 DIAGNOSIS — M9905 Segmental and somatic dysfunction of pelvic region: Secondary | ICD-10-CM | POA: Diagnosis not present

## 2022-04-20 DIAGNOSIS — M9902 Segmental and somatic dysfunction of thoracic region: Secondary | ICD-10-CM | POA: Diagnosis not present

## 2022-04-20 DIAGNOSIS — M5451 Vertebrogenic low back pain: Secondary | ICD-10-CM | POA: Diagnosis not present

## 2022-04-20 DIAGNOSIS — M9907 Segmental and somatic dysfunction of upper extremity: Secondary | ICD-10-CM | POA: Diagnosis not present

## 2022-04-20 DIAGNOSIS — M9903 Segmental and somatic dysfunction of lumbar region: Secondary | ICD-10-CM | POA: Diagnosis not present

## 2022-04-27 DIAGNOSIS — M9907 Segmental and somatic dysfunction of upper extremity: Secondary | ICD-10-CM | POA: Diagnosis not present

## 2022-04-27 DIAGNOSIS — M5451 Vertebrogenic low back pain: Secondary | ICD-10-CM | POA: Diagnosis not present

## 2022-04-27 DIAGNOSIS — M9903 Segmental and somatic dysfunction of lumbar region: Secondary | ICD-10-CM | POA: Diagnosis not present

## 2022-04-27 DIAGNOSIS — M9902 Segmental and somatic dysfunction of thoracic region: Secondary | ICD-10-CM | POA: Diagnosis not present

## 2022-04-27 DIAGNOSIS — M9905 Segmental and somatic dysfunction of pelvic region: Secondary | ICD-10-CM | POA: Diagnosis not present

## 2022-05-13 DIAGNOSIS — M9903 Segmental and somatic dysfunction of lumbar region: Secondary | ICD-10-CM | POA: Diagnosis not present

## 2022-05-13 DIAGNOSIS — M6281 Muscle weakness (generalized): Secondary | ICD-10-CM | POA: Diagnosis not present

## 2022-05-13 DIAGNOSIS — M5451 Vertebrogenic low back pain: Secondary | ICD-10-CM | POA: Diagnosis not present

## 2022-05-13 DIAGNOSIS — M9907 Segmental and somatic dysfunction of upper extremity: Secondary | ICD-10-CM | POA: Diagnosis not present

## 2022-05-13 DIAGNOSIS — R1030 Lower abdominal pain, unspecified: Secondary | ICD-10-CM | POA: Diagnosis not present

## 2022-05-13 DIAGNOSIS — M9902 Segmental and somatic dysfunction of thoracic region: Secondary | ICD-10-CM | POA: Diagnosis not present

## 2022-05-13 DIAGNOSIS — M9905 Segmental and somatic dysfunction of pelvic region: Secondary | ICD-10-CM | POA: Diagnosis not present

## 2022-05-13 DIAGNOSIS — M6200 Separation of muscle (nontraumatic), unspecified site: Secondary | ICD-10-CM | POA: Diagnosis not present

## 2022-05-13 DIAGNOSIS — R102 Pelvic and perineal pain: Secondary | ICD-10-CM | POA: Diagnosis not present

## 2022-05-20 DIAGNOSIS — M5451 Vertebrogenic low back pain: Secondary | ICD-10-CM | POA: Diagnosis not present

## 2022-05-20 DIAGNOSIS — M9905 Segmental and somatic dysfunction of pelvic region: Secondary | ICD-10-CM | POA: Diagnosis not present

## 2022-05-20 DIAGNOSIS — M9907 Segmental and somatic dysfunction of upper extremity: Secondary | ICD-10-CM | POA: Diagnosis not present

## 2022-05-20 DIAGNOSIS — M9902 Segmental and somatic dysfunction of thoracic region: Secondary | ICD-10-CM | POA: Diagnosis not present

## 2022-05-20 DIAGNOSIS — M9903 Segmental and somatic dysfunction of lumbar region: Secondary | ICD-10-CM | POA: Diagnosis not present

## 2022-05-21 ENCOUNTER — Other Ambulatory Visit: Payer: Self-pay | Admitting: Family Medicine

## 2022-05-21 DIAGNOSIS — F40243 Fear of flying: Secondary | ICD-10-CM

## 2022-05-21 MED ORDER — ALPRAZOLAM 0.5 MG PO TABS: ORAL_TABLET | ORAL | 0 refills | Status: DC

## 2022-06-10 DIAGNOSIS — M9907 Segmental and somatic dysfunction of upper extremity: Secondary | ICD-10-CM | POA: Diagnosis not present

## 2022-06-10 DIAGNOSIS — M6200 Separation of muscle (nontraumatic), unspecified site: Secondary | ICD-10-CM | POA: Diagnosis not present

## 2022-06-10 DIAGNOSIS — M9905 Segmental and somatic dysfunction of pelvic region: Secondary | ICD-10-CM | POA: Diagnosis not present

## 2022-06-10 DIAGNOSIS — R1030 Lower abdominal pain, unspecified: Secondary | ICD-10-CM | POA: Diagnosis not present

## 2022-06-10 DIAGNOSIS — R102 Pelvic and perineal pain: Secondary | ICD-10-CM | POA: Diagnosis not present

## 2022-06-10 DIAGNOSIS — M9902 Segmental and somatic dysfunction of thoracic region: Secondary | ICD-10-CM | POA: Diagnosis not present

## 2022-06-10 DIAGNOSIS — M9903 Segmental and somatic dysfunction of lumbar region: Secondary | ICD-10-CM | POA: Diagnosis not present

## 2022-06-10 DIAGNOSIS — M6281 Muscle weakness (generalized): Secondary | ICD-10-CM | POA: Diagnosis not present

## 2022-06-10 DIAGNOSIS — M5451 Vertebrogenic low back pain: Secondary | ICD-10-CM | POA: Diagnosis not present

## 2022-06-24 DIAGNOSIS — M6200 Separation of muscle (nontraumatic), unspecified site: Secondary | ICD-10-CM | POA: Diagnosis not present

## 2022-06-24 DIAGNOSIS — R102 Pelvic and perineal pain: Secondary | ICD-10-CM | POA: Diagnosis not present

## 2022-06-24 DIAGNOSIS — M5451 Vertebrogenic low back pain: Secondary | ICD-10-CM | POA: Diagnosis not present

## 2022-06-24 DIAGNOSIS — M9903 Segmental and somatic dysfunction of lumbar region: Secondary | ICD-10-CM | POA: Diagnosis not present

## 2022-06-24 DIAGNOSIS — M9905 Segmental and somatic dysfunction of pelvic region: Secondary | ICD-10-CM | POA: Diagnosis not present

## 2022-06-24 DIAGNOSIS — M9907 Segmental and somatic dysfunction of upper extremity: Secondary | ICD-10-CM | POA: Diagnosis not present

## 2022-06-24 DIAGNOSIS — R1030 Lower abdominal pain, unspecified: Secondary | ICD-10-CM | POA: Diagnosis not present

## 2022-06-24 DIAGNOSIS — M6281 Muscle weakness (generalized): Secondary | ICD-10-CM | POA: Diagnosis not present

## 2022-06-24 DIAGNOSIS — M9902 Segmental and somatic dysfunction of thoracic region: Secondary | ICD-10-CM | POA: Diagnosis not present

## 2022-07-08 DIAGNOSIS — R1030 Lower abdominal pain, unspecified: Secondary | ICD-10-CM | POA: Diagnosis not present

## 2022-07-08 DIAGNOSIS — M9907 Segmental and somatic dysfunction of upper extremity: Secondary | ICD-10-CM | POA: Diagnosis not present

## 2022-07-08 DIAGNOSIS — M9903 Segmental and somatic dysfunction of lumbar region: Secondary | ICD-10-CM | POA: Diagnosis not present

## 2022-07-08 DIAGNOSIS — R102 Pelvic and perineal pain: Secondary | ICD-10-CM | POA: Diagnosis not present

## 2022-07-08 DIAGNOSIS — M9902 Segmental and somatic dysfunction of thoracic region: Secondary | ICD-10-CM | POA: Diagnosis not present

## 2022-07-08 DIAGNOSIS — M6200 Separation of muscle (nontraumatic), unspecified site: Secondary | ICD-10-CM | POA: Diagnosis not present

## 2022-07-08 DIAGNOSIS — M5451 Vertebrogenic low back pain: Secondary | ICD-10-CM | POA: Diagnosis not present

## 2022-07-08 DIAGNOSIS — M9905 Segmental and somatic dysfunction of pelvic region: Secondary | ICD-10-CM | POA: Diagnosis not present

## 2022-07-08 DIAGNOSIS — M6281 Muscle weakness (generalized): Secondary | ICD-10-CM | POA: Diagnosis not present

## 2022-07-22 DIAGNOSIS — M9905 Segmental and somatic dysfunction of pelvic region: Secondary | ICD-10-CM | POA: Diagnosis not present

## 2022-07-22 DIAGNOSIS — M6200 Separation of muscle (nontraumatic), unspecified site: Secondary | ICD-10-CM | POA: Diagnosis not present

## 2022-07-22 DIAGNOSIS — M6281 Muscle weakness (generalized): Secondary | ICD-10-CM | POA: Diagnosis not present

## 2022-07-22 DIAGNOSIS — M9907 Segmental and somatic dysfunction of upper extremity: Secondary | ICD-10-CM | POA: Diagnosis not present

## 2022-07-22 DIAGNOSIS — M9902 Segmental and somatic dysfunction of thoracic region: Secondary | ICD-10-CM | POA: Diagnosis not present

## 2022-07-22 DIAGNOSIS — M5451 Vertebrogenic low back pain: Secondary | ICD-10-CM | POA: Diagnosis not present

## 2022-07-22 DIAGNOSIS — R1030 Lower abdominal pain, unspecified: Secondary | ICD-10-CM | POA: Diagnosis not present

## 2022-07-22 DIAGNOSIS — M9903 Segmental and somatic dysfunction of lumbar region: Secondary | ICD-10-CM | POA: Diagnosis not present

## 2022-07-22 DIAGNOSIS — R102 Pelvic and perineal pain: Secondary | ICD-10-CM | POA: Diagnosis not present

## 2022-08-05 DIAGNOSIS — R1030 Lower abdominal pain, unspecified: Secondary | ICD-10-CM | POA: Diagnosis not present

## 2022-08-05 DIAGNOSIS — M9907 Segmental and somatic dysfunction of upper extremity: Secondary | ICD-10-CM | POA: Diagnosis not present

## 2022-08-05 DIAGNOSIS — M9905 Segmental and somatic dysfunction of pelvic region: Secondary | ICD-10-CM | POA: Diagnosis not present

## 2022-08-05 DIAGNOSIS — M5451 Vertebrogenic low back pain: Secondary | ICD-10-CM | POA: Diagnosis not present

## 2022-08-05 DIAGNOSIS — M9903 Segmental and somatic dysfunction of lumbar region: Secondary | ICD-10-CM | POA: Diagnosis not present

## 2022-08-05 DIAGNOSIS — M6281 Muscle weakness (generalized): Secondary | ICD-10-CM | POA: Diagnosis not present

## 2022-08-05 DIAGNOSIS — M6200 Separation of muscle (nontraumatic), unspecified site: Secondary | ICD-10-CM | POA: Diagnosis not present

## 2022-08-05 DIAGNOSIS — M9902 Segmental and somatic dysfunction of thoracic region: Secondary | ICD-10-CM | POA: Diagnosis not present

## 2022-08-05 DIAGNOSIS — R102 Pelvic and perineal pain: Secondary | ICD-10-CM | POA: Diagnosis not present

## 2022-08-26 DIAGNOSIS — R102 Pelvic and perineal pain: Secondary | ICD-10-CM | POA: Diagnosis not present

## 2022-08-26 DIAGNOSIS — M6200 Separation of muscle (nontraumatic), unspecified site: Secondary | ICD-10-CM | POA: Diagnosis not present

## 2022-08-26 DIAGNOSIS — M6281 Muscle weakness (generalized): Secondary | ICD-10-CM | POA: Diagnosis not present

## 2022-08-26 DIAGNOSIS — R1030 Lower abdominal pain, unspecified: Secondary | ICD-10-CM | POA: Diagnosis not present

## 2022-08-26 DIAGNOSIS — M5451 Vertebrogenic low back pain: Secondary | ICD-10-CM | POA: Diagnosis not present

## 2022-08-26 DIAGNOSIS — M9903 Segmental and somatic dysfunction of lumbar region: Secondary | ICD-10-CM | POA: Diagnosis not present

## 2022-08-26 DIAGNOSIS — M9902 Segmental and somatic dysfunction of thoracic region: Secondary | ICD-10-CM | POA: Diagnosis not present

## 2022-08-26 DIAGNOSIS — M9905 Segmental and somatic dysfunction of pelvic region: Secondary | ICD-10-CM | POA: Diagnosis not present

## 2022-09-02 DIAGNOSIS — M9903 Segmental and somatic dysfunction of lumbar region: Secondary | ICD-10-CM | POA: Diagnosis not present

## 2022-09-02 DIAGNOSIS — M9907 Segmental and somatic dysfunction of upper extremity: Secondary | ICD-10-CM | POA: Diagnosis not present

## 2022-09-02 DIAGNOSIS — M6200 Separation of muscle (nontraumatic), unspecified site: Secondary | ICD-10-CM | POA: Diagnosis not present

## 2022-09-02 DIAGNOSIS — R1030 Lower abdominal pain, unspecified: Secondary | ICD-10-CM | POA: Diagnosis not present

## 2022-09-02 DIAGNOSIS — M9905 Segmental and somatic dysfunction of pelvic region: Secondary | ICD-10-CM | POA: Diagnosis not present

## 2022-09-02 DIAGNOSIS — M9902 Segmental and somatic dysfunction of thoracic region: Secondary | ICD-10-CM | POA: Diagnosis not present

## 2022-09-02 DIAGNOSIS — M5451 Vertebrogenic low back pain: Secondary | ICD-10-CM | POA: Diagnosis not present

## 2022-09-02 DIAGNOSIS — R102 Pelvic and perineal pain: Secondary | ICD-10-CM | POA: Diagnosis not present

## 2022-09-02 DIAGNOSIS — M6281 Muscle weakness (generalized): Secondary | ICD-10-CM | POA: Diagnosis not present

## 2022-09-23 DIAGNOSIS — R1030 Lower abdominal pain, unspecified: Secondary | ICD-10-CM | POA: Diagnosis not present

## 2022-09-23 DIAGNOSIS — M5451 Vertebrogenic low back pain: Secondary | ICD-10-CM | POA: Diagnosis not present

## 2022-09-23 DIAGNOSIS — M9905 Segmental and somatic dysfunction of pelvic region: Secondary | ICD-10-CM | POA: Diagnosis not present

## 2022-09-23 DIAGNOSIS — M9902 Segmental and somatic dysfunction of thoracic region: Secondary | ICD-10-CM | POA: Diagnosis not present

## 2022-09-23 DIAGNOSIS — M9907 Segmental and somatic dysfunction of upper extremity: Secondary | ICD-10-CM | POA: Diagnosis not present

## 2022-09-23 DIAGNOSIS — M6200 Separation of muscle (nontraumatic), unspecified site: Secondary | ICD-10-CM | POA: Diagnosis not present

## 2022-09-23 DIAGNOSIS — R102 Pelvic and perineal pain: Secondary | ICD-10-CM | POA: Diagnosis not present

## 2022-09-23 DIAGNOSIS — M9903 Segmental and somatic dysfunction of lumbar region: Secondary | ICD-10-CM | POA: Diagnosis not present

## 2022-09-23 DIAGNOSIS — M6281 Muscle weakness (generalized): Secondary | ICD-10-CM | POA: Diagnosis not present

## 2022-09-30 DIAGNOSIS — M9907 Segmental and somatic dysfunction of upper extremity: Secondary | ICD-10-CM | POA: Diagnosis not present

## 2022-09-30 DIAGNOSIS — M9903 Segmental and somatic dysfunction of lumbar region: Secondary | ICD-10-CM | POA: Diagnosis not present

## 2022-09-30 DIAGNOSIS — M5451 Vertebrogenic low back pain: Secondary | ICD-10-CM | POA: Diagnosis not present

## 2022-09-30 DIAGNOSIS — M9905 Segmental and somatic dysfunction of pelvic region: Secondary | ICD-10-CM | POA: Diagnosis not present

## 2022-09-30 DIAGNOSIS — M6200 Separation of muscle (nontraumatic), unspecified site: Secondary | ICD-10-CM | POA: Diagnosis not present

## 2022-09-30 DIAGNOSIS — R1030 Lower abdominal pain, unspecified: Secondary | ICD-10-CM | POA: Diagnosis not present

## 2022-09-30 DIAGNOSIS — M9902 Segmental and somatic dysfunction of thoracic region: Secondary | ICD-10-CM | POA: Diagnosis not present

## 2022-09-30 DIAGNOSIS — M6281 Muscle weakness (generalized): Secondary | ICD-10-CM | POA: Diagnosis not present

## 2022-09-30 DIAGNOSIS — R102 Pelvic and perineal pain: Secondary | ICD-10-CM | POA: Diagnosis not present

## 2022-10-05 DIAGNOSIS — G8929 Other chronic pain: Secondary | ICD-10-CM | POA: Diagnosis not present

## 2022-10-05 DIAGNOSIS — M545 Low back pain, unspecified: Secondary | ICD-10-CM | POA: Diagnosis not present

## 2022-10-05 DIAGNOSIS — M25551 Pain in right hip: Secondary | ICD-10-CM | POA: Diagnosis not present

## 2022-10-13 DIAGNOSIS — M25851 Other specified joint disorders, right hip: Secondary | ICD-10-CM | POA: Diagnosis not present

## 2022-10-14 DIAGNOSIS — M25511 Pain in right shoulder: Secondary | ICD-10-CM | POA: Diagnosis not present

## 2022-10-15 DIAGNOSIS — M25551 Pain in right hip: Secondary | ICD-10-CM | POA: Diagnosis not present

## 2022-10-15 DIAGNOSIS — M5136 Other intervertebral disc degeneration, lumbar region: Secondary | ICD-10-CM | POA: Diagnosis not present

## 2022-11-10 DIAGNOSIS — M62830 Muscle spasm of back: Secondary | ICD-10-CM | POA: Diagnosis not present

## 2022-11-10 DIAGNOSIS — M545 Low back pain, unspecified: Secondary | ICD-10-CM | POA: Diagnosis not present

## 2022-11-19 ENCOUNTER — Other Ambulatory Visit: Payer: Self-pay | Admitting: Family Medicine

## 2022-11-19 DIAGNOSIS — F410 Panic disorder [episodic paroxysmal anxiety] without agoraphobia: Secondary | ICD-10-CM

## 2022-11-19 NOTE — Telephone Encounter (Signed)
Refill request last apt 02/22/22 no future apt.

## 2022-11-25 ENCOUNTER — Encounter: Payer: BC Managed Care – PPO | Admitting: Family Medicine

## 2022-11-25 DIAGNOSIS — M1A9XX0 Chronic gout, unspecified, without tophus (tophi): Secondary | ICD-10-CM | POA: Insufficient documentation

## 2022-11-25 DIAGNOSIS — M7918 Myalgia, other site: Secondary | ICD-10-CM | POA: Diagnosis not present

## 2022-11-25 DIAGNOSIS — M5451 Vertebrogenic low back pain: Secondary | ICD-10-CM | POA: Diagnosis not present

## 2022-11-25 DIAGNOSIS — Z1330 Encounter for screening examination for mental health and behavioral disorders, unspecified: Secondary | ICD-10-CM | POA: Diagnosis not present

## 2022-11-25 DIAGNOSIS — G894 Chronic pain syndrome: Secondary | ICD-10-CM | POA: Diagnosis not present

## 2022-11-25 DIAGNOSIS — M533 Sacrococcygeal disorders, not elsewhere classified: Secondary | ICD-10-CM | POA: Diagnosis not present

## 2022-11-26 ENCOUNTER — Encounter: Payer: Self-pay | Admitting: Family Medicine

## 2022-11-26 ENCOUNTER — Ambulatory Visit: Payer: BC Managed Care – PPO | Admitting: Family Medicine

## 2022-11-26 VITALS — BP 126/82 | HR 84 | Wt 233.4 lb

## 2022-11-26 DIAGNOSIS — M1A9XX0 Chronic gout, unspecified, without tophus (tophi): Secondary | ICD-10-CM | POA: Diagnosis not present

## 2022-11-26 DIAGNOSIS — F410 Panic disorder [episodic paroxysmal anxiety] without agoraphobia: Secondary | ICD-10-CM | POA: Diagnosis not present

## 2022-11-26 DIAGNOSIS — K219 Gastro-esophageal reflux disease without esophagitis: Secondary | ICD-10-CM | POA: Diagnosis not present

## 2022-11-26 DIAGNOSIS — B001 Herpesviral vesicular dermatitis: Secondary | ICD-10-CM

## 2022-11-26 DIAGNOSIS — N529 Male erectile dysfunction, unspecified: Secondary | ICD-10-CM

## 2022-11-26 DIAGNOSIS — Z13220 Encounter for screening for lipoid disorders: Secondary | ICD-10-CM | POA: Diagnosis not present

## 2022-11-26 DIAGNOSIS — I480 Paroxysmal atrial fibrillation: Secondary | ICD-10-CM

## 2022-11-26 DIAGNOSIS — I493 Ventricular premature depolarization: Secondary | ICD-10-CM | POA: Diagnosis not present

## 2022-11-26 DIAGNOSIS — F40243 Fear of flying: Secondary | ICD-10-CM

## 2022-11-26 DIAGNOSIS — R1030 Lower abdominal pain, unspecified: Secondary | ICD-10-CM

## 2022-11-26 DIAGNOSIS — K22719 Barrett's esophagus with dysplasia, unspecified: Secondary | ICD-10-CM

## 2022-11-26 DIAGNOSIS — Z8042 Family history of malignant neoplasm of prostate: Secondary | ICD-10-CM

## 2022-11-26 LAB — CMP14+EGFR

## 2022-11-26 LAB — CBC WITH DIFFERENTIAL/PLATELET
Monocytes: 8 %
RBC: 4.84 x10E6/uL (ref 4.14–5.80)

## 2022-11-26 LAB — LIPID PANEL

## 2022-11-26 MED ORDER — VALACYCLOVIR HCL 1 G PO TABS
1000.0000 mg | ORAL_TABLET | Freq: Two times a day (BID) | ORAL | 1 refills | Status: DC
Start: 2022-11-26 — End: 2023-03-03

## 2022-11-26 MED ORDER — SILDENAFIL CITRATE 100 MG PO TABS
100.0000 mg | ORAL_TABLET | Freq: Every day | ORAL | 2 refills | Status: AC | PRN
Start: 2022-11-26 — End: ?

## 2022-11-26 MED ORDER — PANTOPRAZOLE SODIUM 40 MG PO TBEC: 40.0000 mg | DELAYED_RELEASE_TABLET | Freq: Every day | ORAL | 3 refills | Status: DC

## 2022-11-26 MED ORDER — ALLOPURINOL 300 MG PO TABS: 300.0000 mg | ORAL_TABLET | Freq: Every day | ORAL | 3 refills | Status: DC

## 2022-11-26 MED ORDER — ALPRAZOLAM 0.5 MG PO TABS: ORAL_TABLET | ORAL | 0 refills | Status: DC

## 2022-11-26 MED ORDER — CITALOPRAM HYDROBROMIDE 40 MG PO TABS: 40.0000 mg | ORAL_TABLET | Freq: Every day | ORAL | 3 refills | Status: DC

## 2022-11-26 NOTE — Progress Notes (Signed)
Subjective:    Patient ID: Ronald Clark, male    DOB: 05/11/1976, 47 y.o.   MRN: [id number]  HPI He is here for an interval evaluation.  He has been having some back pain recently and was seen by orthopedics.  They have an MRI scheduled.  He continues to Engineer, maintenance.  He continues on flecainide and does occasionally have irregular heartbeat.  He is comfortable with how to handle this and does occasionally use Xanax for that and also anxiety and flying.  His reflux is under good control and he also has history of Barrett's esophagus.  He continues on allopurinol to take care of his gout and has had no outbreaks of that.  Does have a previous history of umbilical surgery and now notes some pain with lifting inferior to the umbilicus and would like to get this checked out more thoroughly. He also would like a refill on his sildenafil.  He notes that he has had some herpetic outbreaks on his lips and he was given Valtrex which helped quickly take care of this.  He would like his own prescription.  Family history is now significant for father having prostate cancer.  Review of Systems     Objective:    Physical Exam Alert and in no distress. Tympanic membranes and canals are normal. Pharyngeal area is normal. Neck is supple without adenopathy or thyromegaly. Cardiac exam shows a regular sinus rhythm without murmurs or gallops. Lungs are clear to auscultation. Abdominal exam shows no masses or tenderness.       Assessment & Plan:  Symptomatic PVCs - Plan: Lipid panel  Chronic gout without tophus, unspecified cause, unspecified site - Plan: allopurinol (ZYLOPRIM) 300 MG tablet, Uric acid  Panic disorder - Plan: citalopram (CELEXA) 40 MG tablet  Gastroesophageal reflux disease without esophagitis - Plan: pantoprazole (PROTONIX) 40 MG tablet, CBC with Differential/Platelet, CMP14+EGFR  Erectile dysfunction, unspecified erectile dysfunction type - Plan: sildenafil (VIAGRA) 100 MG tablet  PAF  (paroxysmal atrial fibrillation) (HCC)  Barrett's esophagus with dysplasia  Herpes labialis - Plan: valACYclovir (VALTREX) 1000 MG tablet  Family history of prostate cancer in father  Fear of flying - Plan: ALPRAZolam (XANAX) 0.5 MG tablet  Lower abdominal pain - Plan: Ambulatory referral to General Surgery He seems to be doing well on his present medication regimen.  I will also renew his medications and place him on Valtrex.  Explained how to use this medication.  Will refer to general surgery to have them evaluate whether there is an issue with the mesh that was placed when he had the umbilical surgery.  Briefly discussed prostate cancer screening to him explained that we will do this usually around age 44.  He will continue to be followed by cardiology. Problem List Items Addressed This Visit     Barrett's esophagus with dysplasia   Chronic gout without tophus   Relevant Medications   allopurinol (ZYLOPRIM) 300 MG tablet   Other Relevant Orders   Uric acid   Family history of prostate cancer in father   GERD (gastroesophageal reflux disease)   Relevant Medications   pantoprazole (PROTONIX) 40 MG tablet   Other Relevant Orders   CBC with Differential/Platelet   CMP14+EGFR   PAF (paroxysmal atrial fibrillation) (HCC)   Relevant Medications   sildenafil (VIAGRA) 100 MG tablet   Panic disorder   Relevant Medications   citalopram (CELEXA) 40 MG tablet   ALPRAZolam (XANAX) 0.5 MG tablet   Symptomatic PVCs - Primary  Relevant Medications   sildenafil (VIAGRA) 100 MG tablet   Other Relevant Orders   Lipid panel   Other Visit Diagnoses     Erectile dysfunction, unspecified erectile dysfunction type       Relevant Medications   sildenafil (VIAGRA) 100 MG tablet   Herpes labialis       Relevant Medications   valACYclovir (VALTREX) 1000 MG tablet   Fear of flying       Relevant Medications   citalopram (CELEXA) 40 MG tablet   ALPRAZolam (XANAX) 0.5 MG tablet   Lower  abdominal pain       Relevant Orders   Ambulatory referral to General Surgery

## 2022-11-27 LAB — CBC WITH DIFFERENTIAL/PLATELET
Basophils Absolute: 0 10*3/uL (ref 0.0–0.2)
Basos: 1 %
EOS (ABSOLUTE): 0.1 10*3/uL (ref 0.0–0.4)
Eos: 1 %
Hematocrit: 43.9 % (ref 37.5–51.0)
Hemoglobin: 15.1 g/dL (ref 13.0–17.7)
Immature Grans (Abs): 0.1 10*3/uL (ref 0.0–0.1)
Immature Granulocytes: 1 %
Lymphocytes Absolute: 1.2 10*3/uL (ref 0.7–3.1)
Lymphs: 18 %
MCH: 31.2 pg (ref 26.6–33.0)
MCHC: 34.4 g/dL (ref 31.5–35.7)
MCV: 91 fL (ref 79–97)
Monocytes Absolute: 0.5 10*3/uL (ref 0.1–0.9)
Neutrophils Absolute: 4.9 10*3/uL (ref 1.4–7.0)
Neutrophils: 71 %
Platelets: 185 10*3/uL (ref 150–450)
RDW: 12.7 % (ref 11.6–15.4)
WBC: 6.8 10*3/uL (ref 3.4–10.8)

## 2022-11-27 LAB — CMP14+EGFR
ALT: 32 IU/L (ref 0–44)
AST: 21 IU/L (ref 0–40)
Albumin: 4.5 g/dL (ref 4.1–5.1)
BUN/Creatinine Ratio: 15 (ref 9–20)
BUN: 17 mg/dL (ref 6–24)
Bilirubin Total: 0.7 mg/dL (ref 0.0–1.2)
Calcium: 9.8 mg/dL (ref 8.7–10.2)
Chloride: 101 mmol/L (ref 96–106)
Creatinine, Ser: 1.13 mg/dL (ref 0.76–1.27)
Globulin, Total: 2.3 g/dL (ref 1.5–4.5)
Glucose: 94 mg/dL (ref 70–99)
Total Protein: 6.8 g/dL (ref 6.0–8.5)
eGFR: 80 mL/min/{1.73_m2} (ref >59)

## 2022-11-27 LAB — LIPID PANEL
Chol/HDL Ratio: 4.4 ratio (ref 0.0–5.0)
HDL: 50 mg/dL (ref >39)
LDL Chol Calc (NIH): 138 mg/dL — ABNORMAL HIGH (ref 0–99)
Triglycerides: 169 mg/dL — ABNORMAL HIGH (ref 0–149)
VLDL Cholesterol Cal: 30 mg/dL (ref 5–40)

## 2022-11-27 LAB — URIC ACID: Uric Acid: 7.8 mg/dL (ref 3.8–8.4)

## 2022-11-27 MED ORDER — ALLOPURINOL 100 MG PO TABS: 100.0000 mg | ORAL_TABLET | Freq: Every day | ORAL | 3 refills | Status: DC

## 2022-11-27 NOTE — Addendum Note (Signed)
Addended by: Ronnald Nian on: 11/27/2022 10:25 AM   Modules accepted: Orders

## 2022-11-29 ENCOUNTER — Other Ambulatory Visit: Payer: Self-pay

## 2022-11-29 DIAGNOSIS — M1A9XX0 Chronic gout, unspecified, without tophus (tophi): Secondary | ICD-10-CM

## 2022-12-07 DIAGNOSIS — M533 Sacrococcygeal disorders, not elsewhere classified: Secondary | ICD-10-CM | POA: Diagnosis not present

## 2022-12-10 DIAGNOSIS — M5137 Other intervertebral disc degeneration, lumbosacral region: Secondary | ICD-10-CM | POA: Diagnosis not present

## 2022-12-10 DIAGNOSIS — M5136 Other intervertebral disc degeneration, lumbar region: Secondary | ICD-10-CM | POA: Diagnosis not present

## 2022-12-10 DIAGNOSIS — M4317 Spondylolisthesis, lumbosacral region: Secondary | ICD-10-CM | POA: Diagnosis not present

## 2022-12-10 DIAGNOSIS — M4316 Spondylolisthesis, lumbar region: Secondary | ICD-10-CM | POA: Diagnosis not present

## 2022-12-24 DIAGNOSIS — J029 Acute pharyngitis, unspecified: Secondary | ICD-10-CM | POA: Diagnosis not present

## 2022-12-24 DIAGNOSIS — R0789 Other chest pain: Secondary | ICD-10-CM | POA: Diagnosis not present

## 2022-12-24 DIAGNOSIS — R509 Fever, unspecified: Secondary | ICD-10-CM | POA: Diagnosis not present

## 2022-12-24 DIAGNOSIS — Z03818 Encounter for observation for suspected exposure to other biological agents ruled out: Secondary | ICD-10-CM | POA: Diagnosis not present

## 2022-12-24 DIAGNOSIS — R5383 Other fatigue: Secondary | ICD-10-CM | POA: Diagnosis not present

## 2022-12-27 ENCOUNTER — Ambulatory Visit: Payer: BC Managed Care – PPO | Admitting: Family Medicine

## 2022-12-27 ENCOUNTER — Other Ambulatory Visit (INDEPENDENT_AMBULATORY_CARE_PROVIDER_SITE_OTHER): Payer: BC Managed Care – PPO

## 2022-12-27 ENCOUNTER — Encounter: Payer: Self-pay | Admitting: Family Medicine

## 2022-12-27 VITALS — BP 128/76 | HR 87 | Temp 99.6°F | Resp 16 | Wt 232.0 lb

## 2022-12-27 DIAGNOSIS — M533 Sacrococcygeal disorders, not elsewhere classified: Secondary | ICD-10-CM | POA: Diagnosis not present

## 2022-12-27 DIAGNOSIS — G894 Chronic pain syndrome: Secondary | ICD-10-CM | POA: Diagnosis not present

## 2022-12-27 DIAGNOSIS — R051 Acute cough: Secondary | ICD-10-CM | POA: Diagnosis not present

## 2022-12-27 DIAGNOSIS — J209 Acute bronchitis, unspecified: Secondary | ICD-10-CM | POA: Diagnosis not present

## 2022-12-27 DIAGNOSIS — M7918 Myalgia, other site: Secondary | ICD-10-CM | POA: Diagnosis not present

## 2022-12-27 DIAGNOSIS — M5451 Vertebrogenic low back pain: Secondary | ICD-10-CM | POA: Diagnosis not present

## 2022-12-27 LAB — POC COVID19 BINAXNOW: SARS Coronavirus 2 Ag: NEGATIVE

## 2022-12-27 MED ORDER — AZITHROMYCIN 500 MG PO TABS: 500.0000 mg | ORAL_TABLET | Freq: Every day | ORAL | 0 refills | Status: DC

## 2022-12-27 NOTE — Progress Notes (Signed)
   Subjective:    Patient ID: Ronald Clark, male    DOB: 11/25/1975, 47 y.o.   MRN: [id number]  HPI He states that on Tuesday he developed fatigue, sore throat, dry cough.  This continued and on Friday he went to an urgent care center.  A strep, flu and COVID test was all negative.  He developed a productive cough on Saturday.  Today he still does feel fatigued as well as dry cough, malaise and continued productive cough. 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  Review of Systems     Objective:    Physical Exam Alert and in no distress. Tympanic membranes and canals are normal. Pharyngeal area is normal. Neck is supple without adenopathy or thyromegaly. Cardiac exam shows a regular sinus rhythm without murmurs or gallops. Lungs are clear to auscultation. COVID test was negative.       Assessment & Plan:   Problem List Items Addressed This Visit   None Visit Diagnoses     Acute bronchitis, unspecified organism    -  Primary   Relevant Medications   azithromycin (ZITHROMAX) 500 MG tablet     Although he seems to be holding his own I think is reasonable to go ahead and treat him with azithromycin.  He is comfortable with that.

## 2023-01-17 DIAGNOSIS — Z85828 Personal history of other malignant neoplasm of skin: Secondary | ICD-10-CM | POA: Diagnosis not present

## 2023-01-17 DIAGNOSIS — L905 Scar conditions and fibrosis of skin: Secondary | ICD-10-CM | POA: Diagnosis not present

## 2023-01-17 DIAGNOSIS — L821 Other seborrheic keratosis: Secondary | ICD-10-CM | POA: Diagnosis not present

## 2023-01-17 DIAGNOSIS — L570 Actinic keratosis: Secondary | ICD-10-CM | POA: Diagnosis not present

## 2023-02-02 DIAGNOSIS — M9902 Segmental and somatic dysfunction of thoracic region: Secondary | ICD-10-CM | POA: Diagnosis not present

## 2023-02-02 DIAGNOSIS — M9907 Segmental and somatic dysfunction of upper extremity: Secondary | ICD-10-CM | POA: Diagnosis not present

## 2023-02-02 DIAGNOSIS — M5451 Vertebrogenic low back pain: Secondary | ICD-10-CM | POA: Diagnosis not present

## 2023-02-02 DIAGNOSIS — M9905 Segmental and somatic dysfunction of pelvic region: Secondary | ICD-10-CM | POA: Diagnosis not present

## 2023-02-02 DIAGNOSIS — M9903 Segmental and somatic dysfunction of lumbar region: Secondary | ICD-10-CM | POA: Diagnosis not present

## 2023-02-10 DIAGNOSIS — M9905 Segmental and somatic dysfunction of pelvic region: Secondary | ICD-10-CM | POA: Diagnosis not present

## 2023-02-10 DIAGNOSIS — M9907 Segmental and somatic dysfunction of upper extremity: Secondary | ICD-10-CM | POA: Diagnosis not present

## 2023-02-10 DIAGNOSIS — M9903 Segmental and somatic dysfunction of lumbar region: Secondary | ICD-10-CM | POA: Diagnosis not present

## 2023-02-10 DIAGNOSIS — M9902 Segmental and somatic dysfunction of thoracic region: Secondary | ICD-10-CM | POA: Diagnosis not present

## 2023-02-10 DIAGNOSIS — M5451 Vertebrogenic low back pain: Secondary | ICD-10-CM | POA: Diagnosis not present

## 2023-02-22 DIAGNOSIS — B078 Other viral warts: Secondary | ICD-10-CM | POA: Diagnosis not present

## 2023-02-22 DIAGNOSIS — L570 Actinic keratosis: Secondary | ICD-10-CM | POA: Diagnosis not present

## 2023-02-22 DIAGNOSIS — L821 Other seborrheic keratosis: Secondary | ICD-10-CM | POA: Diagnosis not present

## 2023-02-24 ENCOUNTER — Other Ambulatory Visit: Payer: Self-pay

## 2023-02-24 ENCOUNTER — Encounter: Payer: Self-pay | Admitting: Family Medicine

## 2023-02-24 ENCOUNTER — Telehealth: Payer: BC Managed Care – PPO | Admitting: Family Medicine

## 2023-02-24 VITALS — Ht 74.0 in | Wt 230.0 lb

## 2023-02-24 DIAGNOSIS — R051 Acute cough: Secondary | ICD-10-CM

## 2023-02-24 DIAGNOSIS — J029 Acute pharyngitis, unspecified: Secondary | ICD-10-CM | POA: Diagnosis not present

## 2023-02-24 NOTE — Progress Notes (Signed)
Subjective:    Patient ID: Ronald Clark, male    DOB: September 14, 1975, 47 y.o.   MRN: [id number]  HPI Documentation for virtual audio and video telecommunications through Caregility encounter: The patient was located at home. 2 patient identifiers used.  The provider was located in the office. The patient did consent to this visit and is aware of possible charges through their insurance for this visit. The other persons participating in this telemedicine service were none. Time spent on call was 5 minutes and in review of previous records >15 minutes total for counseling and coordination of care. This virtual service is not related to other E/M service within previous 7 days.  He states that 3 days ago he developed a slight sore throat with fatigue that developed into postnasal drainage that became purulent as well as a slight cough with nasal congestion and rhinorrhea.  No fever.  He has not checked for COVID.  He is interested in an antibiotic.  Review of Systems     Objective:    Physical Exam Alert and in no distress.  Voice does sound nasal and he does have some rhinorrhea.       Assessment & Plan:  Sore throat  Acute cough I explained that this is probably viral in nature but I think doing a strep screen and also COVID would be appropriate.  He will be scheduled to have this done tomorrow morning.

## 2023-02-25 ENCOUNTER — Telehealth: Payer: Self-pay

## 2023-02-25 ENCOUNTER — Other Ambulatory Visit (INDEPENDENT_AMBULATORY_CARE_PROVIDER_SITE_OTHER): Payer: BC Managed Care – PPO

## 2023-02-25 DIAGNOSIS — J029 Acute pharyngitis, unspecified: Secondary | ICD-10-CM | POA: Diagnosis not present

## 2023-02-25 DIAGNOSIS — R051 Acute cough: Secondary | ICD-10-CM

## 2023-02-25 LAB — POCT FLU A/B STATUS
Influenza A, POC: NEGATIVE
Influenza B, POC: NEGATIVE

## 2023-02-25 LAB — POC COVID19 BINAXNOW: SARS Coronavirus 2 Ag: POSITIVE — AB

## 2023-02-25 LAB — POCT RAPID STREP A (OFFICE): Rapid Strep A Screen: NEGATIVE

## 2023-02-25 NOTE — Telephone Encounter (Signed)
Per pt request, called to give results but left voicemail to call back.   Pt test results are neg for flu and strep but pos for covid.

## 2023-02-25 NOTE — Telephone Encounter (Signed)
Pt notified of results and CDC covid recommendations

## 2023-03-03 ENCOUNTER — Telehealth: Payer: Self-pay | Admitting: Family Medicine

## 2023-03-03 DIAGNOSIS — B001 Herpesviral vesicular dermatitis: Secondary | ICD-10-CM

## 2023-03-03 DIAGNOSIS — F40243 Fear of flying: Secondary | ICD-10-CM

## 2023-03-03 MED ORDER — ALPRAZOLAM 0.5 MG PO TABS
ORAL_TABLET | ORAL | 0 refills | Status: DC
Start: 2023-03-03 — End: 2023-04-06

## 2023-03-03 MED ORDER — VALACYCLOVIR HCL 1 G PO TABS
1000.0000 mg | ORAL_TABLET | Freq: Two times a day (BID) | ORAL | 1 refills | Status: AC
Start: 2023-03-03 — End: ?

## 2023-03-03 NOTE — Telephone Encounter (Signed)
Pt needs refill Xanax & Valtrex per Lafonda Mosses

## 2023-03-03 NOTE — Telephone Encounter (Addendum)
Can you please refill patient Xanax and Valtrex per Lafonda Mosses as Dr. Susann Givens approved this

## 2023-03-08 ENCOUNTER — Other Ambulatory Visit: Payer: Self-pay | Admitting: Family Medicine

## 2023-03-24 DIAGNOSIS — D485 Neoplasm of uncertain behavior of skin: Secondary | ICD-10-CM | POA: Diagnosis not present

## 2023-03-24 DIAGNOSIS — L570 Actinic keratosis: Secondary | ICD-10-CM | POA: Diagnosis not present

## 2023-03-24 DIAGNOSIS — B078 Other viral warts: Secondary | ICD-10-CM | POA: Diagnosis not present

## 2023-03-28 DIAGNOSIS — L570 Actinic keratosis: Secondary | ICD-10-CM | POA: Diagnosis not present

## 2023-03-28 DIAGNOSIS — D485 Neoplasm of uncertain behavior of skin: Secondary | ICD-10-CM | POA: Diagnosis not present

## 2023-03-28 DIAGNOSIS — B078 Other viral warts: Secondary | ICD-10-CM | POA: Diagnosis not present

## 2023-04-04 ENCOUNTER — Other Ambulatory Visit: Payer: BC Managed Care – PPO

## 2023-04-06 ENCOUNTER — Other Ambulatory Visit: Payer: Self-pay | Admitting: Family Medicine

## 2023-04-06 DIAGNOSIS — F40243 Fear of flying: Secondary | ICD-10-CM

## 2023-04-06 MED ORDER — ALPRAZOLAM 0.5 MG PO TABS
ORAL_TABLET | ORAL | 0 refills | Status: DC
Start: 2023-04-06 — End: 2023-07-08

## 2023-04-06 NOTE — Progress Notes (Signed)
He is getting ready to go to [place].  Apparently his father is not doing well and could potentially die soon.  I will renew his Xanax for his flights.

## 2023-07-08 ENCOUNTER — Telehealth: Payer: Self-pay | Admitting: Internal Medicine

## 2023-07-08 ENCOUNTER — Other Ambulatory Visit: Payer: Self-pay | Admitting: Nurse Practitioner

## 2023-07-08 DIAGNOSIS — F410 Panic disorder [episodic paroxysmal anxiety] without agoraphobia: Secondary | ICD-10-CM

## 2023-07-08 DIAGNOSIS — F40243 Fear of flying: Secondary | ICD-10-CM

## 2023-07-08 MED ORDER — ZOLPIDEM TARTRATE 10 MG PO TABS
ORAL_TABLET | ORAL | 0 refills | Status: AC
Start: 2023-07-08 — End: ?

## 2023-07-08 MED ORDER — ALPRAZOLAM 0.5 MG PO TABS
ORAL_TABLET | ORAL | 0 refills | Status: DC
Start: 2023-07-08 — End: 2024-01-10

## 2023-07-08 NOTE — Telephone Encounter (Signed)
Dr. Susann Givens oked for you to send in xanax and sleep medicine to pharmacy. His dad has just died and he will be flying to be with family

## 2023-08-02 ENCOUNTER — Encounter: Payer: Self-pay | Admitting: Internal Medicine

## 2023-09-14 DIAGNOSIS — S46011A Strain of muscle(s) and tendon(s) of the rotator cuff of right shoulder, initial encounter: Secondary | ICD-10-CM | POA: Diagnosis not present

## 2023-09-14 DIAGNOSIS — M25511 Pain in right shoulder: Secondary | ICD-10-CM | POA: Diagnosis not present

## 2023-10-05 DIAGNOSIS — M25511 Pain in right shoulder: Secondary | ICD-10-CM | POA: Diagnosis not present

## 2023-10-05 DIAGNOSIS — S46011A Strain of muscle(s) and tendon(s) of the rotator cuff of right shoulder, initial encounter: Secondary | ICD-10-CM | POA: Diagnosis not present

## 2023-10-12 DIAGNOSIS — M25511 Pain in right shoulder: Secondary | ICD-10-CM | POA: Diagnosis not present

## 2023-10-12 DIAGNOSIS — S46011A Strain of muscle(s) and tendon(s) of the rotator cuff of right shoulder, initial encounter: Secondary | ICD-10-CM | POA: Diagnosis not present

## 2023-12-28 DIAGNOSIS — I499 Cardiac arrhythmia, unspecified: Secondary | ICD-10-CM | POA: Diagnosis not present

## 2023-12-28 DIAGNOSIS — I493 Ventricular premature depolarization: Secondary | ICD-10-CM | POA: Diagnosis not present

## 2023-12-28 DIAGNOSIS — I4891 Unspecified atrial fibrillation: Secondary | ICD-10-CM | POA: Diagnosis not present

## 2024-01-02 DIAGNOSIS — L509 Urticaria, unspecified: Secondary | ICD-10-CM | POA: Diagnosis not present

## 2024-01-02 DIAGNOSIS — R5381 Other malaise: Secondary | ICD-10-CM | POA: Diagnosis not present

## 2024-01-02 DIAGNOSIS — T675XXA Heat exhaustion, unspecified, initial encounter: Secondary | ICD-10-CM | POA: Diagnosis not present

## 2024-01-03 ENCOUNTER — Other Ambulatory Visit: Payer: Self-pay | Admitting: Family Medicine

## 2024-01-03 DIAGNOSIS — F410 Panic disorder [episodic paroxysmal anxiety] without agoraphobia: Secondary | ICD-10-CM

## 2024-01-03 DIAGNOSIS — M1A9XX0 Chronic gout, unspecified, without tophus (tophi): Secondary | ICD-10-CM

## 2024-01-03 DIAGNOSIS — K219 Gastro-esophageal reflux disease without esophagitis: Secondary | ICD-10-CM

## 2024-01-03 MED ORDER — ALLOPURINOL 300 MG PO TABS: 300.0000 mg | ORAL_TABLET | Freq: Every day | ORAL | 3 refills | Status: AC

## 2024-01-03 MED ORDER — CITALOPRAM HYDROBROMIDE 40 MG PO TABS: 40.0000 mg | ORAL_TABLET | Freq: Every day | ORAL | 3 refills | Status: AC

## 2024-01-03 MED ORDER — PANTOPRAZOLE SODIUM 40 MG PO TBEC: 40.0000 mg | DELAYED_RELEASE_TABLET | Freq: Every day | ORAL | 3 refills | Status: AC

## 2024-01-10 ENCOUNTER — Ambulatory Visit: Admitting: Family Medicine

## 2024-01-10 ENCOUNTER — Encounter: Payer: Self-pay | Admitting: Family Medicine

## 2024-01-10 VITALS — BP 104/60 | HR 64 | Ht 75.0 in | Wt 232.0 lb

## 2024-01-10 DIAGNOSIS — Z13220 Encounter for screening for lipoid disorders: Secondary | ICD-10-CM

## 2024-01-10 DIAGNOSIS — R197 Diarrhea, unspecified: Secondary | ICD-10-CM

## 2024-01-10 DIAGNOSIS — F40243 Fear of flying: Secondary | ICD-10-CM | POA: Diagnosis not present

## 2024-01-10 DIAGNOSIS — Z2821 Immunization not carried out because of patient refusal: Secondary | ICD-10-CM

## 2024-01-10 DIAGNOSIS — Z860100 Personal history of colon polyps, unspecified: Secondary | ICD-10-CM

## 2024-01-10 DIAGNOSIS — R7989 Other specified abnormal findings of blood chemistry: Secondary | ICD-10-CM

## 2024-01-10 DIAGNOSIS — K22719 Barrett's esophagus with dysplasia, unspecified: Secondary | ICD-10-CM

## 2024-01-10 DIAGNOSIS — I480 Paroxysmal atrial fibrillation: Secondary | ICD-10-CM

## 2024-01-10 DIAGNOSIS — Z8042 Family history of malignant neoplasm of prostate: Secondary | ICD-10-CM

## 2024-01-10 DIAGNOSIS — M1A00X0 Idiopathic chronic gout, unspecified site, without tophus (tophi): Secondary | ICD-10-CM

## 2024-01-10 DIAGNOSIS — K2100 Gastro-esophageal reflux disease with esophagitis, without bleeding: Secondary | ICD-10-CM

## 2024-01-10 DIAGNOSIS — F410 Panic disorder [episodic paroxysmal anxiety] without agoraphobia: Secondary | ICD-10-CM

## 2024-01-10 DIAGNOSIS — Z8711 Personal history of peptic ulcer disease: Secondary | ICD-10-CM

## 2024-01-10 LAB — CBC WITH DIFFERENTIAL/PLATELET

## 2024-01-10 LAB — LIPID PANEL

## 2024-01-10 MED ORDER — ALPRAZOLAM 0.5 MG PO TABS: ORAL_TABLET | ORAL | 0 refills | Status: DC

## 2024-01-10 NOTE — Patient Instructions (Signed)
 As many as 8 Imodium per day for the diarrhea and also use a probiotic

## 2024-01-10 NOTE — Progress Notes (Signed)
   Subjective:    Patient ID: Ronald Clark, male    DOB: 06-Aug-1975, 48 y.o.   MRN: [id number]  HPI He is here for medication check.  Hi s main concern today is difficulty with diarrhea.  He relates this to heat exhaustion.  He did go to the emergency room after that because of difficulty with diarrhea, nausea and weakness.  This has been going on now for about 9 days.  He has had loose watery stools but no fever, chills, abdominal pain.  He has not traveled, drink well water or any medications changes. He also has a history of gout and is doing well with no flares.  Psychologically things are going well for him.  He is divorced but he and his ex seem to be getting along well and the children are not having any major difficulties.  He does occasionally use Xanax  as well as a sleep medication.  He does have underlying reflux disease as well as Barrett's esophagus and is on regular PPIs.  He does see cardiology regularly.  There is a family history of prostate cancer and he would like to follow-up on that.  He does have a history of colonic polyps however they are hyperplastic.  He does not smoke and says that he is not drink on a daily basis.  He does has 4 5.   Review of Systems  All other systems reviewed and are negative.      Objective:    Physical Exam Alert and in no distress. Tympanic membranes and canals are normal. Pharyngeal area is normal. Neck is supple without adenopathy or thyromegaly. Cardiac exam shows a regular sinus rhythm without murmurs or gallops. Lungs are clear to auscultation.  Abdominal exam shows no masses or tenderness with decreased bowel sounds.        Assessment & Plan:  Diarrhea, unspecified type  Family history of prostate cancer in father - Plan: PSA  History of colonic polyps - hyperplastic  Fear of flying - Plan: ALPRAZolam  (XANAX ) 0.5 MG tablet  Barrett's esophagus with dysplasia  Idiopathic chronic gout without tophus, unspecified site  PAF  (paroxysmal atrial fibrillation) (HCC)  History of peptic ulcer  Gastroesophageal reflux disease with esophagitis without hemorrhage - Plan: CBC with Differential/Platelet, Comprehensive metabolic panel with GFR  Screening for lipid disorders - Plan: Lipid panel  Panic disorder  His medications have already been renewed and I did renew his Xanax  as he is getting ready to start another soccer season and is now at [employer].  He will continue on his present medication regimen.  Discussed immunizations with him including for flu, COVID, Shingrix and pneumonia and he will defer at the present time. For the diarrhea I recommend conservative care with Imodium up to 8/day as well as probiotics and if continued difficulty we will pursue further action.  Over 45 minutes spent discussing all these issues with him.

## 2024-01-11 ENCOUNTER — Ambulatory Visit: Payer: Self-pay | Admitting: Family Medicine

## 2024-01-11 LAB — CBC WITH DIFFERENTIAL/PLATELET
Basophils Absolute: 0 x10E3/uL (ref 0.0–0.2)
Basos: 0
EOS (ABSOLUTE): 0.1 x10E3/uL (ref 0.0–0.4)
Eos: 1
Hematocrit: 48.4 (ref 37.5–51.0)
Hemoglobin: 16.4 g/dL (ref 13.0–17.7)
Lymphocytes Absolute: 1.8 x10E3/uL (ref 0.7–3.1)
Lymphs: 23
MCH: 31.4 pg (ref 26.6–33.0)
MCHC: 33.9 g/dL (ref 31.5–35.7)
MCV: 93 fL (ref 79–97)
Monocytes Absolute: 0.5 x10E3/uL (ref 0.1–0.9)
Monocytes: 6
Neutrophils Absolute: 5.3 x10E3/uL (ref 1.4–7.0)
Neutrophils: 69
Platelets: 240 x10E3/uL (ref 150–450)
RBC: 5.22 x10E6/uL (ref 4.14–5.80)
RDW: 12.7 (ref 11.6–15.4)
WBC: 7.7 x10E3/uL (ref 3.4–10.8)

## 2024-01-11 LAB — COMPREHENSIVE METABOLIC PANEL WITH GFR
ALT: 26 IU/L (ref 0–44)
AST: 14 IU/L (ref 0–40)
Albumin: 4.4 g/dL (ref 4.1–5.1)
Alkaline Phosphatase: 91 IU/L (ref 44–121)
BUN/Creatinine Ratio: 15 (ref 9–20)
BUN: 19 mg/dL (ref 6–24)
Bilirubin Total: 0.7 mg/dL (ref 0.0–1.2)
CO2: 25 mmol/L (ref 20–29)
Calcium: 10 mg/dL (ref 8.7–10.2)
Chloride: 98 mmol/L (ref 96–106)
Creatinine, Ser: 1.3 mg/dL — AB (ref 0.76–1.27)
Globulin, Total: 2.6 g/dL (ref 1.5–4.5)
Glucose: 85 mg/dL (ref 70–99)
Potassium: 4.8 mmol/L (ref 3.5–5.2)
Sodium: 140 mmol/L (ref 134–144)
Total Protein: 7 g/dL (ref 6.0–8.5)
eGFR: 67 mL/min/1.73 (ref >59)

## 2024-01-11 LAB — LIPID PANEL
Cholesterol, Total: 206 mg/dL — AB (ref 100–199)
HDL: 42 mg/dL (ref >39)
LDL CALC COMMENT:: 4.9 ratio (ref 0.0–5.0)
LDL Chol Calc (NIH): 119 mg/dL — AB (ref 0–99)
Triglycerides: 258 mg/dL — AB (ref 0–149)
VLDL Cholesterol Cal: 45 mg/dL — AB (ref 5–40)

## 2024-01-11 LAB — IMMATURE CELLS: MYELOCYTES: 1 % — ABNORMAL HIGH (ref 0–0)

## 2024-01-11 LAB — PSA: Prostate Specific Ag, Serum: 0.5 ng/mL (ref 0.0–4.0)

## 2024-01-11 NOTE — Addendum Note (Signed)
 Addended by: JOYCE NORLEEN BROCKS on: 01/11/2024 10:23 AM   Modules accepted: Orders

## 2024-01-17 ENCOUNTER — Encounter: Admitting: Family Medicine

## 2024-01-31 ENCOUNTER — Other Ambulatory Visit: Payer: Self-pay | Admitting: Family Medicine

## 2024-01-31 MED ORDER — FLECAINIDE ACETATE 150 MG PO TABS: 150.0000 mg | ORAL_TABLET | Freq: Two times a day (BID) | ORAL | 0 refills | Status: DC

## 2024-01-31 NOTE — Progress Notes (Signed)
 Flecainide  called in as he left the prescription in and another city

## 2024-02-01 ENCOUNTER — Other Ambulatory Visit: Payer: Self-pay

## 2024-02-01 ENCOUNTER — Other Ambulatory Visit: Payer: Self-pay | Admitting: Family Medicine

## 2024-02-01 DIAGNOSIS — M1A9XX0 Chronic gout, unspecified, without tophus (tophi): Secondary | ICD-10-CM

## 2024-02-14 ENCOUNTER — Other Ambulatory Visit

## 2024-02-14 DIAGNOSIS — R7989 Other specified abnormal findings of blood chemistry: Secondary | ICD-10-CM

## 2024-02-14 LAB — CBC WITH DIFFERENTIAL/PLATELET
Basophils Absolute: 0 x10E3/uL (ref 0.0–0.2)
Basos: 1 %
EOS (ABSOLUTE): 0.1 x10E3/uL (ref 0.0–0.4)
Eos: 2 %
Hematocrit: 47.1 % (ref 37.5–51.0)
Hemoglobin: 15.4 g/dL (ref 13.0–17.7)
Immature Grans (Abs): 0 x10E3/uL (ref 0.0–0.1)
Immature Granulocytes: 0 %
Lymphocytes Absolute: 0.9 x10E3/uL (ref 0.7–3.1)
Lymphs: 16 %
MCH: 30.7 pg (ref 26.6–33.0)
MCHC: 32.7 g/dL (ref 31.5–35.7)
MCV: 94 fL (ref 79–97)
Monocytes Absolute: 0.5 x10E3/uL (ref 0.1–0.9)
Monocytes: 8 %
Neutrophils Absolute: 4.2 x10E3/uL (ref 1.4–7.0)
Neutrophils: 73 %
Platelets: 177 x10E3/uL (ref 150–450)
RBC: 5.02 x10E6/uL (ref 4.14–5.80)
RDW: 12.5 % (ref 11.6–15.4)
WBC: 5.8 x10E3/uL (ref 3.4–10.8)

## 2024-02-16 DIAGNOSIS — L570 Actinic keratosis: Secondary | ICD-10-CM | POA: Diagnosis not present

## 2024-02-16 DIAGNOSIS — D485 Neoplasm of uncertain behavior of skin: Secondary | ICD-10-CM | POA: Diagnosis not present

## 2024-02-16 DIAGNOSIS — Z1283 Encounter for screening for malignant neoplasm of skin: Secondary | ICD-10-CM | POA: Diagnosis not present

## 2024-02-16 DIAGNOSIS — L245 Irritant contact dermatitis due to other chemical products: Secondary | ICD-10-CM | POA: Diagnosis not present

## 2024-03-03 ENCOUNTER — Other Ambulatory Visit: Payer: Self-pay | Admitting: Family Medicine

## 2024-03-05 NOTE — Telephone Encounter (Signed)
 Is this okay to refill?

## 2024-03-06 DIAGNOSIS — D0439 Carcinoma in situ of skin of other parts of face: Secondary | ICD-10-CM | POA: Diagnosis not present

## 2024-03-06 DIAGNOSIS — D485 Neoplasm of uncertain behavior of skin: Secondary | ICD-10-CM | POA: Diagnosis not present

## 2024-04-06 DIAGNOSIS — D0430 Carcinoma in situ of skin of unspecified part of face: Secondary | ICD-10-CM | POA: Diagnosis not present

## 2024-04-06 DIAGNOSIS — D0439 Carcinoma in situ of skin of other parts of face: Secondary | ICD-10-CM | POA: Diagnosis not present

## 2024-04-12 DIAGNOSIS — L570 Actinic keratosis: Secondary | ICD-10-CM | POA: Diagnosis not present

## 2024-04-12 DIAGNOSIS — Z4802 Encounter for removal of sutures: Secondary | ICD-10-CM | POA: Diagnosis not present

## 2024-04-12 DIAGNOSIS — B009 Herpesviral infection, unspecified: Secondary | ICD-10-CM | POA: Diagnosis not present

## 2024-05-15 ENCOUNTER — Other Ambulatory Visit: Payer: Self-pay | Admitting: Family Medicine

## 2024-05-15 DIAGNOSIS — F40243 Fear of flying: Secondary | ICD-10-CM

## 2024-05-15 MED ORDER — ALPRAZOLAM 0.5 MG PO TABS: ORAL_TABLET | ORAL | 0 refills | Status: AC

## 2024-07-06 ENCOUNTER — Ambulatory Visit: Payer: Self-pay | Admitting: Family Medicine

## 2024-07-06 NOTE — Telephone Encounter (Signed)
" °  FYI Only or Action Required?: Action required by provider: requesting methocarbamol rx.  Patient was last seen in primary care on 01/10/2024 by Joyce Norleen BROCKS, MD.  Called Nurse Triage reporting Back Pain.  Symptoms began several days ago.  Interventions attempted: Rest, hydration, or home remedies.  Symptoms are: stable.  Triage Disposition: See PCP When Office is Open (Within 3 Days)  Patient/caregiver understands and will follow disposition?: No, wishes to speak with PCP Reason for Triage: Back pain for 3 days. Pain level 8. Would like Methocarbamol   Pharmacy:  [id number] [address] [facility], [place] - [address]  [address] [place] [address]  Phone: [phone] Fax: [phone]  Hours: Not open 24 hours  Reason for Disposition  [1] MODERATE back pain (e.g., interferes with normal activities) AND [2] present > 3 days  Answer Assessment - Initial Assessment Questions Declines appt  1. ONSET: When did the pain begin? (e.g., minutes, hours, days)     Few days ago 2. LOCATION: Where does it hurt? (upper, mid or lower back)     Rt low back 3. SEVERITY: How bad is the pain?  (e.g., Scale 1-10; mild, moderate, or severe)     moderate 4. PATTERN: Is the pain constant? (e.g., yes, no; constant, intermittent)      yes 5. RADIATION: Does the pain shoot into your legs or somewhere else?     denies 6. CAUSE:  What do you think is causing the back pain?      Chronic pain with flares 9. NEUROLOGIC SYMPTOMS: Do you have any weakness, numbness, or problems with bowel/bladder control?     denies  Protocols used: Back Pain-A-AH  "
